# Patient Record
Sex: Female | Born: 1950 | Race: White | Hispanic: No | Smoking: Never smoker
Health system: Southern US, Community
[De-identification: ages and names within clinical notes are randomized; demographics above are authoritative.]

---

## 1998-05-04 ENCOUNTER — Ambulatory Visit (HOSPITAL_COMMUNITY): Admission: RE | Admit: 1998-05-04 | Discharge: 1998-05-04 | Payer: Self-pay | Admitting: *Deleted

## 1998-10-20 ENCOUNTER — Ambulatory Visit (HOSPITAL_COMMUNITY): Admission: RE | Admit: 1998-10-20 | Discharge: 1998-10-20 | Payer: Self-pay | Admitting: *Deleted

## 1998-11-17 ENCOUNTER — Other Ambulatory Visit: Admission: RE | Admit: 1998-11-17 | Discharge: 1998-11-17 | Payer: Self-pay | Admitting: *Deleted

## 1999-10-31 ENCOUNTER — Ambulatory Visit (HOSPITAL_COMMUNITY): Admission: RE | Admit: 1999-10-31 | Discharge: 1999-10-31 | Payer: Self-pay | Admitting: *Deleted

## 2000-11-04 ENCOUNTER — Ambulatory Visit (HOSPITAL_COMMUNITY): Admission: RE | Admit: 2000-11-04 | Discharge: 2000-11-04 | Payer: Self-pay | Admitting: *Deleted

## 2000-12-26 ENCOUNTER — Other Ambulatory Visit: Admission: RE | Admit: 2000-12-26 | Discharge: 2000-12-26 | Payer: Self-pay | Admitting: *Deleted

## 2001-04-27 ENCOUNTER — Encounter: Admission: RE | Admit: 2001-04-27 | Discharge: 2001-05-26 | Payer: Self-pay | Admitting: Family Medicine

## 2001-12-18 ENCOUNTER — Ambulatory Visit (HOSPITAL_COMMUNITY): Admission: RE | Admit: 2001-12-18 | Discharge: 2001-12-18 | Payer: Self-pay | Admitting: *Deleted

## 2002-01-28 ENCOUNTER — Encounter: Admission: RE | Admit: 2002-01-28 | Discharge: 2002-01-28 | Payer: Self-pay | Admitting: Family Medicine

## 2002-01-28 ENCOUNTER — Encounter: Payer: Self-pay | Admitting: Family Medicine

## 2002-02-09 ENCOUNTER — Encounter (INDEPENDENT_AMBULATORY_CARE_PROVIDER_SITE_OTHER): Payer: Self-pay | Admitting: *Deleted

## 2002-02-09 ENCOUNTER — Ambulatory Visit (HOSPITAL_COMMUNITY): Admission: RE | Admit: 2002-02-09 | Discharge: 2002-02-09 | Payer: Self-pay | Admitting: Family Medicine

## 2002-02-09 ENCOUNTER — Encounter: Payer: Self-pay | Admitting: Family Medicine

## 2003-04-29 ENCOUNTER — Ambulatory Visit (HOSPITAL_COMMUNITY): Admission: RE | Admit: 2003-04-29 | Discharge: 2003-04-29 | Payer: Self-pay | Admitting: *Deleted

## 2004-06-05 ENCOUNTER — Ambulatory Visit (HOSPITAL_COMMUNITY): Admission: RE | Admit: 2004-06-05 | Discharge: 2004-06-05 | Payer: Self-pay | Admitting: *Deleted

## 2004-12-19 ENCOUNTER — Ambulatory Visit: Payer: Self-pay | Admitting: Family Medicine

## 2005-01-16 ENCOUNTER — Ambulatory Visit: Payer: Self-pay | Admitting: Internal Medicine

## 2005-01-22 ENCOUNTER — Ambulatory Visit: Payer: Self-pay | Admitting: Internal Medicine

## 2005-06-25 ENCOUNTER — Ambulatory Visit: Payer: Self-pay | Admitting: Family Medicine

## 2005-07-03 ENCOUNTER — Ambulatory Visit (HOSPITAL_COMMUNITY): Admission: RE | Admit: 2005-07-03 | Discharge: 2005-07-03 | Payer: Self-pay | Admitting: *Deleted

## 2005-12-18 ENCOUNTER — Ambulatory Visit: Payer: Self-pay | Admitting: Family Medicine

## 2006-03-31 ENCOUNTER — Ambulatory Visit: Payer: Self-pay | Admitting: Family Medicine

## 2006-04-07 ENCOUNTER — Ambulatory Visit: Payer: Self-pay | Admitting: Family Medicine

## 2006-04-18 ENCOUNTER — Ambulatory Visit: Payer: Self-pay | Admitting: Internal Medicine

## 2006-05-28 ENCOUNTER — Encounter: Admission: RE | Admit: 2006-05-28 | Discharge: 2006-05-28 | Payer: Self-pay | Admitting: Surgery

## 2006-06-05 ENCOUNTER — Encounter: Admission: RE | Admit: 2006-06-05 | Discharge: 2006-06-05 | Payer: Self-pay | Admitting: Surgery

## 2006-06-27 ENCOUNTER — Ambulatory Visit (HOSPITAL_COMMUNITY): Admission: RE | Admit: 2006-06-27 | Discharge: 2006-06-27 | Payer: Self-pay | Admitting: Surgery

## 2006-06-27 ENCOUNTER — Encounter (INDEPENDENT_AMBULATORY_CARE_PROVIDER_SITE_OTHER): Payer: Self-pay | Admitting: Specialist

## 2006-08-26 ENCOUNTER — Ambulatory Visit (HOSPITAL_COMMUNITY): Admission: RE | Admit: 2006-08-26 | Discharge: 2006-08-26 | Payer: Self-pay | Admitting: *Deleted

## 2007-05-08 IMAGING — US US ABDOMEN COMPLETE
1 series · 13 of 25 positions shown · non-contrast
Comparison: No prior ultrasound.  I do have the CT from [REDACTED] with the report.

CLINICAL DATA: Right upper quadrant pain.  The patient is known to have gallstones from a CT scan done at [REDACTED] in January 2004.  
 ABDOMEN ULTRASOUND:
TECHNIQUE: Complete abdominal ultrasound examination was performed including evaluation of the liver, gallbladder, bile ducts, pancreas, kidneys, spleen, IVC, and abdominal aorta.

[Series 1: us abdomen complete · 0.26mm/px · 13 of 95 slices shown]
[im 1/95]
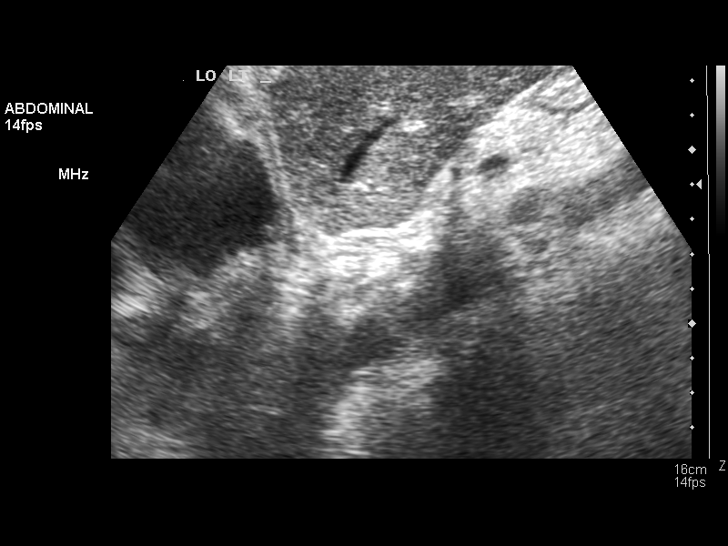
[im 8/95]
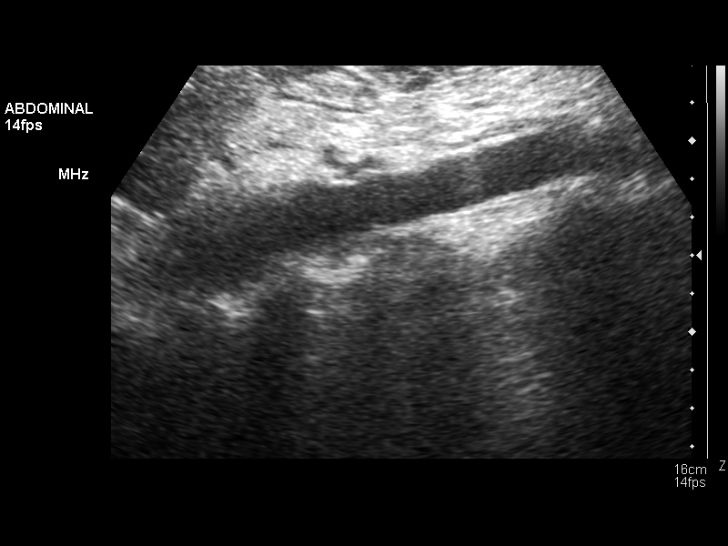
[im 16/95]
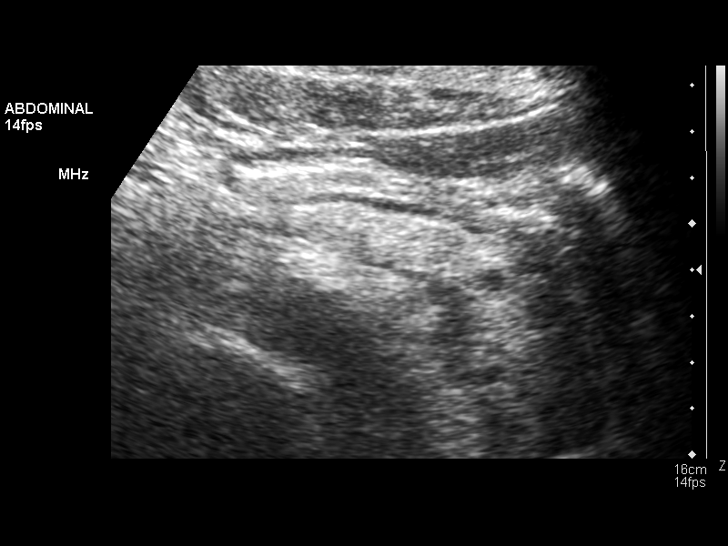
[im 24/95]
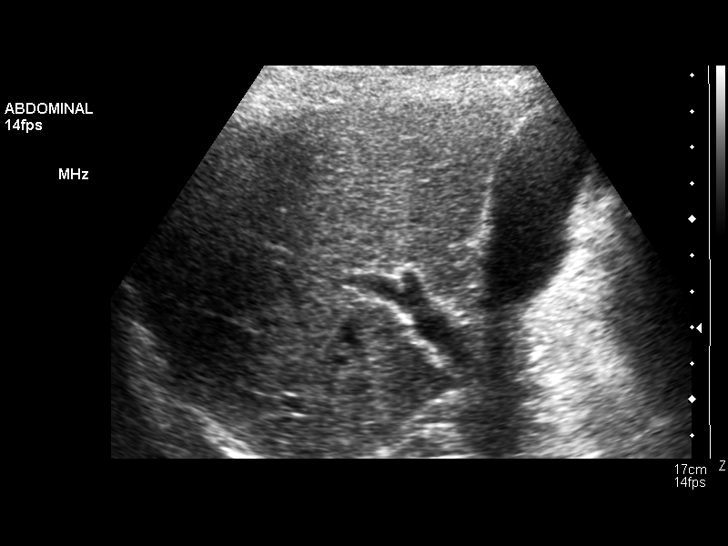
[im 32/95]
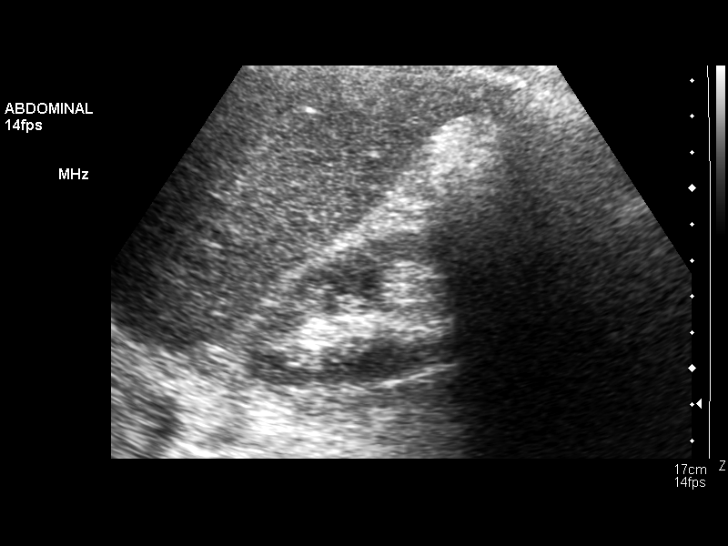
[im 40/95]
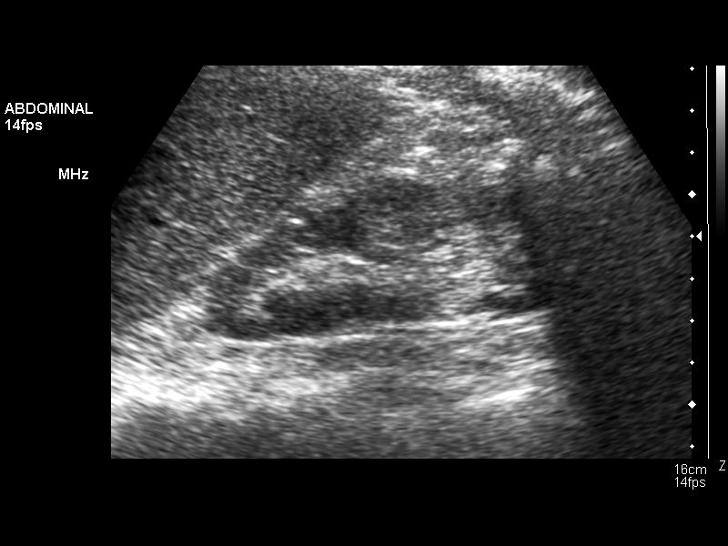
[im 48/95]
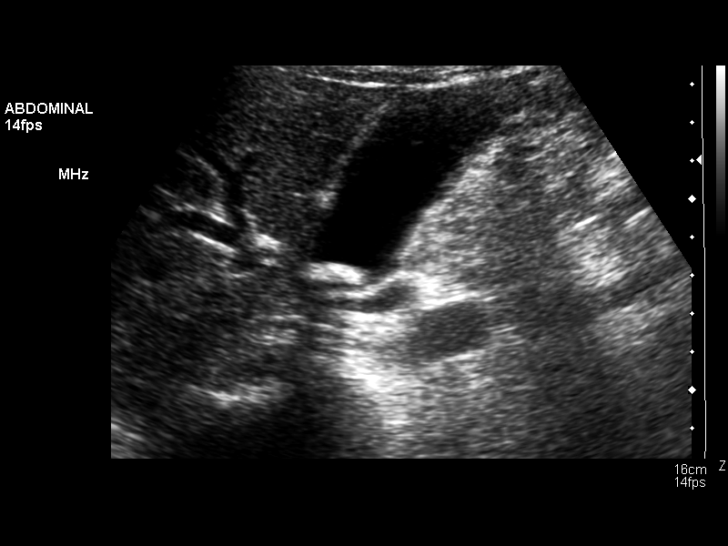
[im 55/95]
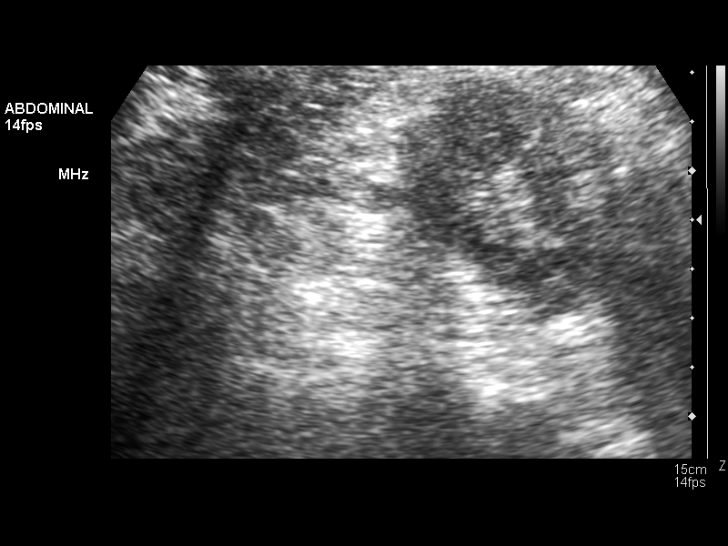
[im 63/95]
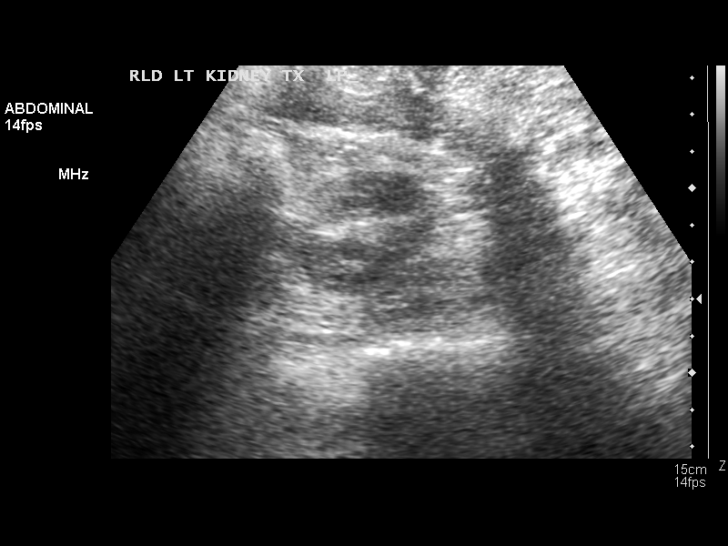
[im 71/95]
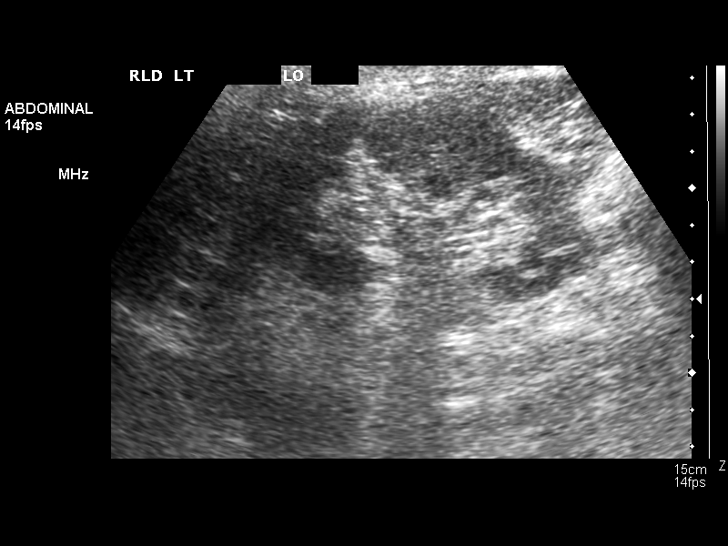
[im 79/95]
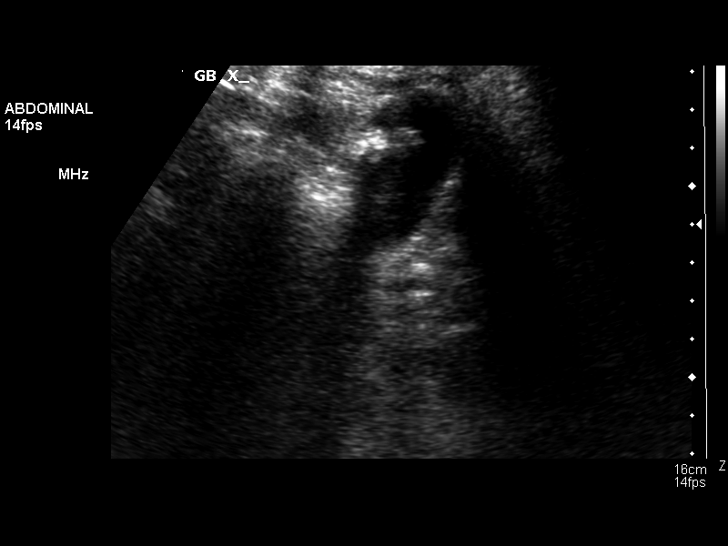
[im 87/95]
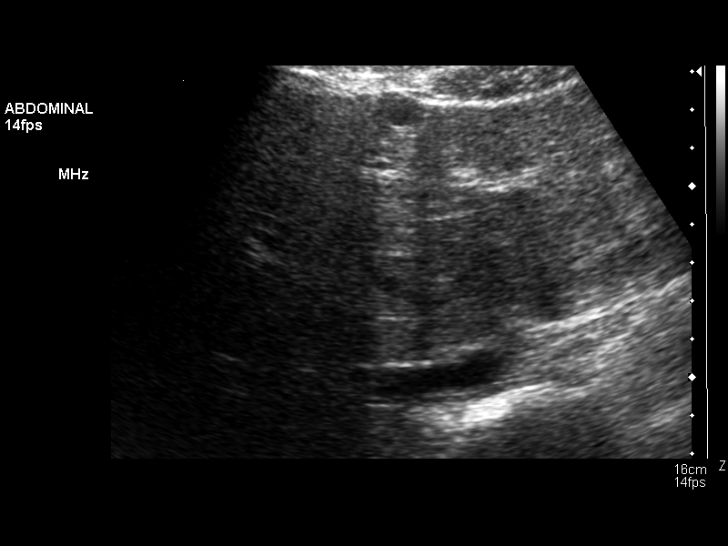
[im 95/95]
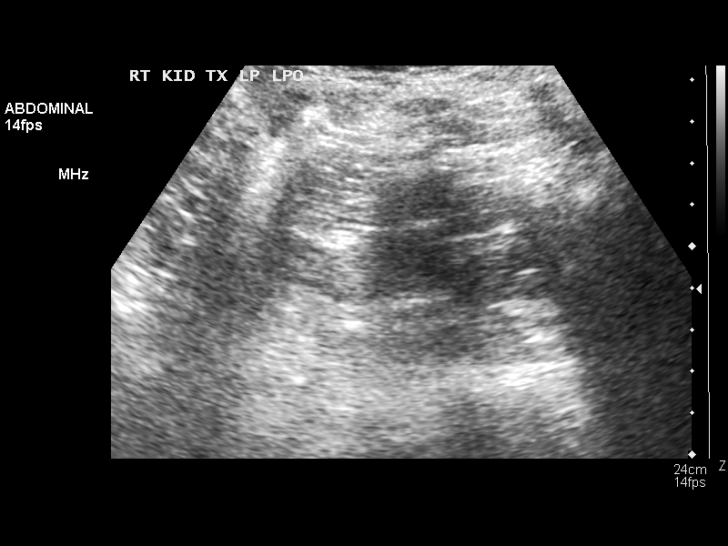

[13 of 25 positions shown; findings below may reference images not displayed]

FINDINGS: This exam confirms a 1.2 cm calcified gallstone in the neck of the gallbladder.  There is no distention of the gallbladder.  No wall thickening or edema.  No intra or extrahepatic biliary dilatation.  Common duct 3.6 mm.  Liver normal except for a 1.4 cm hypoechoic lesion in the anterior left lobe.  A 7.5 mm lesion was described on the prior CT in this same area.  Pancreas and spleen normal.  Kidney size normal without stones or obstruction.  The CT describes a subcentimeter low density lesion in the superior pole of the right kidney.  This is not seen sonographically.
 A followup CT was recommended at the time of [REDACTED] study, to further assess the lesion in the left lobe of the liver and the right kidney.  I would recommend that we repeat the CT, if it has not been done. 
 Aorta and IVC normal.  No ascites.
IMPRESSION: 1.  Cholelithiasis without evidence for cholecystitis. 
 2.  Normal biliary tree.
 3.  There is a 1.4 cm hypoechoic lesion in the left lobe of the liver.  This lesion measured 7.5 mm on a prior CT in January 2004, done at [REDACTED].
 4.  A subcentimeter lesion in the right kidney, noted on the prior CT, is not seen on today?s exam. 
 5.  Consider followup CT to further assess the liver and right kidney, as was recommended in January 2004.

## 2007-08-24 ENCOUNTER — Ambulatory Visit: Payer: Self-pay | Admitting: Family Medicine

## 2007-08-24 LAB — CONVERTED CEMR LAB
ALT: 17 units/L (ref 0–35)
AST: 17 units/L (ref 0–37)
Basophils Absolute: 0 10*3/uL (ref 0.0–0.1)
Basophils Relative: 0 % (ref 0.0–1.0)
Bilirubin Urine: NEGATIVE
Bilirubin, Direct: 0.1 mg/dL (ref 0.0–0.3)
CO2: 31 meq/L (ref 19–32)
Calcium: 9.3 mg/dL (ref 8.4–10.5)
Eosinophils Absolute: 0.1 10*3/uL (ref 0.0–0.6)
GFR calc Af Amer: 95 mL/min
GFR calc non Af Amer: 78 mL/min
Glucose, Bld: 93 mg/dL (ref 70–99)
Glucose, Urine, Semiquant: NEGATIVE
HDL: 66.5 mg/dL (ref >39.0)
MCHC: 33.4 g/dL (ref 30.0–36.0)
Monocytes Relative: 9.2 % (ref 3.0–11.0)
Neutro Abs: 1.8 10*3/uL (ref 1.4–7.7)
Platelets: 310 10*3/uL (ref 150–400)
Protein, U semiquant: NEGATIVE
RDW: 11.6 % (ref 11.5–14.6)
Specific Gravity, Urine: 1.025
TSH: 0.49 microintl units/mL (ref 0.35–5.50)
Total CHOL/HDL Ratio: 3.7
Total Protein: 6.8 g/dL (ref 6.0–8.3)
Triglycerides: 80 mg/dL (ref 0–149)
VLDL: 16 mg/dL (ref 0–40)
WBC Urine, dipstick: NEGATIVE
WBC: 3.9 10*3/uL — ABNORMAL LOW (ref 4.5–10.5)

## 2007-08-31 ENCOUNTER — Ambulatory Visit: Payer: Self-pay | Admitting: Family Medicine

## 2007-08-31 DIAGNOSIS — N959 Unspecified menopausal and perimenopausal disorder: Secondary | ICD-10-CM | POA: Insufficient documentation

## 2007-08-31 DIAGNOSIS — E039 Hypothyroidism, unspecified: Secondary | ICD-10-CM | POA: Insufficient documentation

## 2007-11-16 ENCOUNTER — Ambulatory Visit (HOSPITAL_COMMUNITY): Admission: RE | Admit: 2007-11-16 | Discharge: 2007-11-16 | Payer: Self-pay | Admitting: Obstetrics & Gynecology

## 2008-01-24 ENCOUNTER — Emergency Department (HOSPITAL_COMMUNITY): Admission: EM | Admit: 2008-01-24 | Discharge: 2008-01-24 | Payer: Self-pay | Admitting: Emergency Medicine

## 2008-01-25 ENCOUNTER — Ambulatory Visit: Payer: Self-pay | Admitting: Family Medicine

## 2008-01-25 DIAGNOSIS — I4949 Other premature depolarization: Secondary | ICD-10-CM

## 2009-01-24 ENCOUNTER — Ambulatory Visit (HOSPITAL_COMMUNITY): Admission: RE | Admit: 2009-01-24 | Discharge: 2009-01-24 | Payer: Self-pay | Admitting: Obstetrics & Gynecology

## 2009-11-16 ENCOUNTER — Ambulatory Visit: Payer: Self-pay | Admitting: Family Medicine

## 2009-11-16 DIAGNOSIS — H9209 Otalgia, unspecified ear: Secondary | ICD-10-CM | POA: Insufficient documentation

## 2009-11-16 LAB — CONVERTED CEMR LAB
ALT: 16 units/L (ref 0–35)
AST: 21 units/L (ref 0–37)
Alkaline Phosphatase: 68 units/L (ref 39–117)
Basophils Absolute: 0 10*3/uL (ref 0.0–0.1)
Bilirubin Urine: NEGATIVE
CO2: 30 meq/L (ref 19–32)
Chloride: 102 meq/L (ref 96–112)
Cholesterol: 248 mg/dL — ABNORMAL HIGH (ref 0–200)
Direct LDL: 169.4 mg/dL
Eosinophils Absolute: 0.1 10*3/uL (ref 0.0–0.7)
Eosinophils Relative: 2.6 % (ref 0.0–5.0)
Glucose, Bld: 87 mg/dL (ref 70–99)
Glucose, Urine, Semiquant: NEGATIVE
Hemoglobin: 13.2 g/dL (ref 12.0–15.0)
MCHC: 34.1 g/dL (ref 30.0–36.0)
Monocytes Absolute: 0.4 10*3/uL (ref 0.1–1.0)
Neutrophils Relative %: 55.6 % (ref 43.0–77.0)
Platelets: 318 10*3/uL (ref 150.0–400.0)
Protein, U semiquant: NEGATIVE
RBC: 4.03 M/uL (ref 3.87–5.11)
Sodium: 141 meq/L (ref 135–145)
Total Bilirubin: 0.7 mg/dL (ref 0.3–1.2)
Triglycerides: 156 mg/dL — ABNORMAL HIGH (ref 0.0–149.0)

## 2009-11-23 ENCOUNTER — Ambulatory Visit: Payer: Self-pay | Admitting: Family Medicine

## 2009-11-23 DIAGNOSIS — Z87448 Personal history of other diseases of urinary system: Secondary | ICD-10-CM

## 2009-12-22 ENCOUNTER — Encounter (INDEPENDENT_AMBULATORY_CARE_PROVIDER_SITE_OTHER): Payer: Self-pay | Admitting: *Deleted

## 2010-03-02 ENCOUNTER — Ambulatory Visit (HOSPITAL_COMMUNITY): Admission: RE | Admit: 2010-03-02 | Discharge: 2010-03-02 | Payer: Self-pay | Admitting: Obstetrics & Gynecology

## 2011-01-06 ENCOUNTER — Encounter: Payer: Self-pay | Admitting: Obstetrics & Gynecology

## 2011-01-17 NOTE — Letter (Signed)
Summary: Colonoscopy Date Change Letter  [facility]  [address] [place], [place] [address]   Phone: [phone]  Fax: [phone]      December 22, 2009 MRN: [id number]   Holly Sims [address] [place], [place]  [address]   Dear [name],   Previously you were recommended to have a repeat colonoscopy around this time. Your chart was recently reviewed by Dr. Hedwig Morton. Juanda Chance of [facility]. Follow up colonoscopy is now recommended in February 2013. This revised recommendation is based on current, nationally recognized guidelines for colorectal cancer screening and polyp surveillance. These guidelines are endorsed by the American Cancer Society, [employer] on Colorectal Cancer as well as numerous other major medical organizations.  Please understand that our recommendation assumes that you do not have any new symptoms such as bleeding, a change in bowel habits, anemia, or significant abdominal discomfort. If you do have any concerning GI symptoms or want to discuss the guideline recommendations, please call to arrange an office visit at your earliest convenience. Otherwise we will keep you in our reminder system and contact you 1-2 months prior to the date listed above to schedule your next colonoscopy.  Thank you,  Hedwig Morton. Juanda Chance, M.D.  [facility] [phone]

## 2011-04-12 ENCOUNTER — Other Ambulatory Visit (HOSPITAL_COMMUNITY): Payer: Self-pay | Admitting: Obstetrics & Gynecology

## 2011-04-12 DIAGNOSIS — Z1231 Encounter for screening mammogram for malignant neoplasm of breast: Secondary | ICD-10-CM

## 2011-04-19 ENCOUNTER — Ambulatory Visit (HOSPITAL_COMMUNITY)
Admission: RE | Admit: 2011-04-19 | Discharge: 2011-04-19 | Disposition: A | Discharge status: Home / Self Care | Payer: 59 | Source: Ambulatory Visit | Attending: Obstetrics & Gynecology | Admitting: Obstetrics & Gynecology

## 2011-04-19 DIAGNOSIS — Z1231 Encounter for screening mammogram for malignant neoplasm of breast: Secondary | ICD-10-CM | POA: Insufficient documentation

## 2011-05-03 NOTE — Op Note (Signed)
NAME:  Holly Sims, Holly Sims NO.:  [id number]   MEDICAL RECORD NO.:  [id number]          PATIENT TYPE:  AMB   LOCATION:  DAY                          FACILITY:  [facility]   PHYSICIAN:  Currie Paris, M.D.DATE OF BIRTH:  12/14/1951   DATE OF PROCEDURE:  06/27/2006  DATE OF DISCHARGE:                                 OPERATIVE REPORT   OFFICE MEDICAL RECORD NUMBER [id number] [phone].   PREOPERATIVE DIAGNOSIS:  Gallstones.   POSTOPERATIVE DIAGNOSIS:  Gallstones.   OPERATION:  Laparoscopic cholecystectomy with operative cholangiogram.   SURGEON:  Currie Paris, M.D.   ASSISTANT:  Anselm Pancoast. Zachery Dakins, M.D.   ANESTHESIA:  General endotracheal.   CLINICAL HISTORY:  This is a 60 year old lady who is known to have a single  large stone in her gallbladder but had presented now with some increasing of  biliary-type symptoms and elected to proceed to cholecystectomy.   DESCRIPTION OF PROCEDURE:  The patient was seen in the holding area and  confirmed that cholecystectomy was the planned procedure.  She had no  further questions.   She was taken to the operating room and after satisfactory general  endotracheal anesthesia had been obtained, the abdomen was prepped and  draped as a sterile field.  The time-out occurred.   Plain 0.25% Marcaine infiltrated in the umbilical area.  The incision was  made, the fascia opened, and the perineal cavity entered under direct  vision.  A pursestring was placed and the Hasson introduced and the abdomen  insufflated to 15.   The patient was placed in reverse Trendelenburg and tilted to the left.  We  saw no gross abnormalities in examining the abdomen.  The gallbladder did  have multiple omental adhesions.  The gallbladder was retracted over the  liver and the omentum dissected down until we could identify the triangle of  Calot.  That was dissected out nicely and I had a long section of cystic  duct and also saw the cystic artery, had  that dissected out so we had a nice  window.   I put a single clip on the cystic artery and a single clip on the duct at  the junction with the gallbladder.  The cystic duct was opened and a Cook  catheter introduced percutaneously for cholangiography.   The operative cholangiogram showed a very long cystic duct with a low entry  point.  The common hepatic duct and radicles filled nicely and there was  good flow into the duodenum.  There was a small question of filling defect  distally and review with the radiologist thought this was something  extrinsic but not clinically significant.   The cystic duct catheter was removed and 3 clips placed on the stay side of  the cystic duct and it was divided.  Additional clips were placed on the  cystic artery so we had three clips on the stay side and it was divided.  The gallbladder was removed from below to above with coagulation current of  the cautery.  A small posterior vessel was also clipped.   The gallbladder was placed  in a bag.  I irrigated and made sure everything  was dry.   The gallbladder was then brought out the umbilical port.  We did a final  check for hemostasis and again everything appeared to be dry.  The lateral  ports were removed under direct vision and there was no bleeding.  The  umbilical port was closed with the Vicryl pursestring suture.  The abdomen  was deflated through the epigastric port.  The skin was closed with 4-0  Monocryl subcuticular and Dermabond.   The patient tolerated the procedure well.  There were no operative  complications.  All counts were correct.      Currie Paris, M.D.  Electronically Signed     CJS/MEDQ  D:  06/27/2006  T:  06/27/2006  Job:  [id number]

## 2011-09-06 LAB — CBC
HCT: 37.3
Hemoglobin: 12.9
MCHC: 34.5
MCV: 91.3
Platelets: 269
RDW: 12.4

## 2011-09-06 LAB — POCT CARDIAC MARKERS
CKMB, poc: <1 — ABNORMAL LOW
Myoglobin, poc: 89.9
Operator id: 284251

## 2011-09-06 LAB — POCT I-STAT CREATININE: Creatinine, Ser: 1.1

## 2011-09-06 LAB — I-STAT 8, (EC8 V) (CONVERTED LAB)
Acid-Base Excess: 1
Chloride: 107
HCT: 40
Sodium: 138
pCO2, Ven: 34.1 — ABNORMAL LOW

## 2011-09-06 LAB — DIFFERENTIAL: Basophils Absolute: 0

## 2011-10-14 ENCOUNTER — Ambulatory Visit (INDEPENDENT_AMBULATORY_CARE_PROVIDER_SITE_OTHER): Payer: 59 | Admitting: Family Medicine

## 2011-10-14 ENCOUNTER — Encounter: Payer: Self-pay | Admitting: Family Medicine

## 2011-10-14 DIAGNOSIS — F4321 Adjustment disorder with depressed mood: Secondary | ICD-10-CM

## 2011-10-14 DIAGNOSIS — J301 Allergic rhinitis due to pollen: Secondary | ICD-10-CM | POA: Insufficient documentation

## 2011-10-14 MED ORDER — PREDNISONE 20 MG PO TABS: ORAL_TABLET | ORAL | Status: DC

## 2011-10-14 MED ORDER — LORAZEPAM 1 MG PO TABS: ORAL_TABLET | ORAL | Status: DC

## 2011-10-14 NOTE — Progress Notes (Signed)
  Subjective:    Patient ID: Holly Sims, female    DOB: July 14, 1951, 60 y.o.   MRN: [id number]  HPI Alexsia is a 60 year old recently widowed female......... Her husband died 6 months ago..... Pancreatic cancer......... Who comes in with a two week history of head congestion, postnasal drip, and cough.  Alona Bene has trouble in the fall with her, allergies she's tried over-the-counter medications.  Nothing seems to help.  She is coughing, but not wheezing, and no fever.   Review of Systems    General and ENT and pulmonary review of systems otherwise negative Objective:   Physical Exam  Well-developed well-nourished, female, no acute distress.  Examination of the HEENT were negative, except for 4+ nasal edema.  Neck is supple.  Lungs are clear      Assessment & Plan:  Allergic rhinitis........... Prednisone burst and taper

## 2011-10-14 NOTE — Patient Instructions (Signed)
Begin the prednisone as directed.  Ativan one half or one tablet at bedtime p.r.n.  Return next Thursday at 4 p.m. For your physical exam we member regular breakfast light lunch so we can do your lab work.  The same day

## 2011-10-24 ENCOUNTER — Ambulatory Visit (INDEPENDENT_AMBULATORY_CARE_PROVIDER_SITE_OTHER): Payer: 59 | Admitting: Family Medicine

## 2011-10-24 ENCOUNTER — Encounter: Payer: Self-pay | Admitting: Family Medicine

## 2011-10-24 DIAGNOSIS — Z87448 Personal history of other diseases of urinary system: Secondary | ICD-10-CM

## 2011-10-24 DIAGNOSIS — E039 Hypothyroidism, unspecified: Secondary | ICD-10-CM

## 2011-10-24 DIAGNOSIS — Z0000 Encounter for general adult medical examination without abnormal findings: Secondary | ICD-10-CM

## 2011-10-24 LAB — POCT URINALYSIS DIPSTICK
Ketones, UA: NEGATIVE
Nitrite, UA: NEGATIVE
Protein, UA: NEGATIVE
Urobilinogen, UA: 0.2

## 2011-10-24 MED ORDER — LEVOTHYROXINE SODIUM 50 MCG PO TABS: 50.0000 ug | ORAL_TABLET | Freq: Every day | ORAL | Status: DC

## 2011-10-24 NOTE — Progress Notes (Signed)
  Subjective:    Patient ID: Holly Sims, female    DOB: [birth year], 60 y.o.   MRN: [id number]  HPI  Holly Sims is a 60 year old recently widowed female.  Her husband Louanne Skye and died last summer from pancreatic cancer, who comes in today for general physical examination because of a history of hypothyroidism,  She's always been in excellent health except for the thyroid.  She has no chronic health problems.  She gets routine eye care, dental care, BSE monthly, and he mammography, colonoscopy 4 years ago, showed one small polyp.  Tetanus 2005, mammogram, August 2012, packed August 2012, declines a flu shot, but once the shingles, vaccine  Review of Systems  Constitutional: Negative.   HENT: Negative.   Eyes: Negative.   Respiratory: Negative.   Cardiovascular: Negative.   Gastrointestinal: Negative.   Genitourinary: Negative.   Musculoskeletal: Negative.   Neurological: Negative.   Hematological: Negative.   Psychiatric/Behavioral: Negative.        Objective:   Physical Exam  Constitutional: She appears well-developed and well-nourished.  HENT:  Head: Normocephalic and atraumatic.  Right Ear: External ear normal.  Left Ear: External ear normal.  Nose: Nose normal.  Mouth/Throat: Oropharynx is clear and moist.  Eyes: EOM are normal. Pupils are equal, round, and reactive to light.  Neck: Normal range of motion. Neck supple. No thyromegaly present.  Cardiovascular: Normal rate, regular rhythm, normal heart sounds and intact distal pulses.  Exam reveals no gallop and no friction rub.   No murmur heard. Pulmonary/Chest: Effort normal and breath sounds normal.  Abdominal: Soft. Bowel sounds are normal. She exhibits no distension and no mass. There is no tenderness. There is no rebound.  Genitourinary: Guaiac negative stool.       Bilateral breast exam normal  Musculoskeletal: Normal range of motion.  Lymphadenopathy:    She has no cervical adenopathy.  Neurological: She is alert. She  has normal reflexes. No cranial nerve deficit. She exhibits normal muscle tone. Coordination normal.  Skin: Skin is warm and dry.  Psychiatric: She has a normal mood and affect. Her behavior is normal. Judgment and thought content normal.          Assessment & Plan:  Healthy female.  History of hypothyroidism.  Continue Synthroid.  Check labs

## 2011-10-24 NOTE — Patient Instructions (Signed)
Continue your current medications.  Follow-up in one year or sooner if any problem.  Call  when you find out if you can get the shingles.  Vaccine here

## 2011-10-25 LAB — CBC WITH DIFFERENTIAL/PLATELET
Basophils Absolute: 0 10*3/uL (ref 0.0–0.1)
Eosinophils Relative: 2.6 % (ref 0.0–5.0)
MCV: 93.8 fl (ref 78.0–100.0)
Monocytes Absolute: 0.4 10*3/uL (ref 0.1–1.0)
Neutrophils Relative %: 54.5 % (ref 43.0–77.0)
Platelets: 307 10*3/uL (ref 150.0–400.0)
RDW: 12.8 % (ref 11.5–14.6)
WBC: 4.4 10*3/uL — ABNORMAL LOW (ref 4.5–10.5)

## 2011-10-25 LAB — BASIC METABOLIC PANEL
Calcium: 9.2 mg/dL (ref 8.4–10.5)
Chloride: 104 mEq/L (ref 96–112)
Creatinine, Ser: 1 mg/dL (ref 0.4–1.2)
GFR: 62.42 mL/min (ref >60.00)

## 2011-10-25 LAB — TSH: TSH: 0.22 u[IU]/mL — ABNORMAL LOW (ref 0.35–5.50)

## 2011-10-25 LAB — LIPID PANEL
Cholesterol: 214 mg/dL — ABNORMAL HIGH (ref 0–200)
Triglycerides: 112 mg/dL (ref 0.0–149.0)

## 2011-10-25 LAB — HEPATIC FUNCTION PANEL
ALT: 16 U/L (ref 0–35)
AST: 16 U/L (ref 0–37)
Albumin: 4.1 g/dL (ref 3.5–5.2)

## 2011-10-25 LAB — LDL CHOLESTEROL, DIRECT: Direct LDL: 143.4 mg/dL

## 2011-10-29 NOTE — Progress Notes (Signed)
Quick Note:  Pt informed on VM to call back to document the chart if blood in urine is a chronic thing. ______

## 2011-11-04 ENCOUNTER — Telehealth: Payer: Self-pay | Admitting: *Deleted

## 2011-11-04 NOTE — Telephone Encounter (Signed)
Patient is calling because she received a copy of her lab results.  She noticed that her TSH is low and would like to know if she should change her thyroid medication dosage?

## 2011-11-04 NOTE — Telephone Encounter (Signed)
The same dose, but only take it Monday through Friday

## 2011-11-05 NOTE — Telephone Encounter (Signed)
Left message on machine for patient  To return our call 

## 2011-12-11 ENCOUNTER — Ambulatory Visit (INDEPENDENT_AMBULATORY_CARE_PROVIDER_SITE_OTHER): Payer: 59 | Admitting: Family Medicine

## 2011-12-11 ENCOUNTER — Encounter: Payer: Self-pay | Admitting: Family Medicine

## 2011-12-11 VITALS — BP 120/80 | Temp 98.8°F | Wt 138.0 lb

## 2011-12-11 DIAGNOSIS — J0100 Acute maxillary sinusitis, unspecified: Secondary | ICD-10-CM

## 2011-12-11 MED ORDER — AZITHROMYCIN 250 MG PO TABS: ORAL_TABLET | ORAL | Status: DC

## 2011-12-11 NOTE — Progress Notes (Signed)
  Subjective:    Patient ID: Holly Sims, female    DOB: 04/07/1951, 60 y.o.   MRN: [id number]  HPI 60 year old white female, nonsmoker, and with complaints of sinus pressure and pain decreased smell and taste cough with productive yellow sputum and one off for a week and worsening. She has not taken any medications over-the-counter. Denies any sick contacts   Review of Systems  Constitutional: Negative.   HENT: Positive for congestion, sneezing, postnasal drip and sinus pressure.   Respiratory: Positive for cough.   Cardiovascular: Negative.   Skin: Negative.   Neurological: Negative.   Hematological: Negative.    No past medical history on file.  History   Social History  . Marital Status: Widowed    Spouse Name: N/A    Number of Children: N/A  . Years of Education: N/A   Occupational History  . Not on file.   Social History Main Topics  . Smoking status: Never Smoker   . Smokeless tobacco: Never Used  . Alcohol Use: Yes     wine occ.   . Drug Use: Not on file  . Sexually Active: Not on file   Other Topics Concern  . Not on file   Social History Narrative  . No narrative on file    No past surgical history on file.  No family history on file.  Allergies  Allergen Reactions  . Penicillins     REACTION: anaphylaxsis  . Povidone-Iodine     REACTION: rash    Current Outpatient Prescriptions on File Prior to Visit  Medication Sig Dispense Refill  . levothyroxine (SYNTHROID, LEVOTHROID) 50 MCG tablet Take 1 tablet (50 mcg total) by mouth daily.  100 tablet  3  . LORazepam (ATIVAN) 1 MG tablet One tab nightly p.r.n.  30 tablet  3  . predniSONE (DELTASONE) 20 MG tablet Two tabs x 3 days, one x 3 days, a half x 3 days, then half a tab Monday, Wednesday, Friday, for a two-week taper  30 tablet  1    BP 120/80  Temp(Src) 98.8 F (37.1 C) (Oral)  Wt 138 lb (62.596 kg)chart   Objective:   Physical Exam  Constitutional: She is oriented to person, place, and  time.  Pulmonary/Chest: Effort normal and breath sounds normal.  Abdominal: Soft. Bowel sounds are normal.  Musculoskeletal: Normal range of motion.  Neurological: She is alert and oriented to person, place, and time.  Skin: Skin is warm and dry.          Assessment & Plan:  Assessment: Acute sinusitis  Plan: Over-the-counter antihistamine once daily. Z-Pak as directed. Rest. Drink plenty of fluids. Call if symptoms worsen or persist. Recheck if there's a when necessary.

## 2011-12-16 ENCOUNTER — Ambulatory Visit (INDEPENDENT_AMBULATORY_CARE_PROVIDER_SITE_OTHER): Payer: 59 | Admitting: Family Medicine

## 2011-12-16 ENCOUNTER — Encounter: Payer: Self-pay | Admitting: Family Medicine

## 2011-12-16 VITALS — BP 118/78 | Temp 98.1°F | Wt 139.0 lb

## 2011-12-16 DIAGNOSIS — J45909 Unspecified asthma, uncomplicated: Secondary | ICD-10-CM

## 2011-12-16 DIAGNOSIS — R062 Wheezing: Secondary | ICD-10-CM

## 2011-12-16 DIAGNOSIS — R05 Cough: Secondary | ICD-10-CM

## 2011-12-16 NOTE — Progress Notes (Signed)
  Subjective:    Patient ID: Holly Sims, female    DOB: [birth year], 60 y.o.   MRN: [id number]  HPI Holly Sims is a 60 year old recently widowed female........ Husband died last summer from pancreatic cancer.......... Who comes in today for evaluation of a congestion, sore throat, and cough.  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  She's had these symptoms now for two weeks.  No pertinent previous history of allergy.  Her symptoms are a congestion, postnasal drip, and cough without sputum production.   Review of Systems    General and pulmonary venous systems otherwise negative Objective:   Physical Exam  Well developed, well nourished, female no acute distress.  HEENT are negative.  Neck was supple.  No adenopathy.  Lungs are clear except for some mild late expiratory wheezing.      Assessment & Plan:  Viral syndrome or allergic reaction was mild secondary wheezing.  Plan prednisone burst and taper.  Return p.r.n.

## 2011-12-16 NOTE — Patient Instructions (Signed)
Take the prednisone as directed.  Return p.r.n. 

## 2011-12-29 ENCOUNTER — Other Ambulatory Visit: Payer: Self-pay | Admitting: Family Medicine

## 2012-02-07 ENCOUNTER — Encounter: Payer: Self-pay | Admitting: Internal Medicine

## 2012-05-26 ENCOUNTER — Other Ambulatory Visit (HOSPITAL_COMMUNITY): Payer: Self-pay | Admitting: Obstetrics & Gynecology

## 2012-05-26 DIAGNOSIS — Z1231 Encounter for screening mammogram for malignant neoplasm of breast: Secondary | ICD-10-CM

## 2012-06-19 ENCOUNTER — Ambulatory Visit (HOSPITAL_COMMUNITY): Payer: 59

## 2012-07-06 ENCOUNTER — Ambulatory Visit (HOSPITAL_COMMUNITY)
Admission: RE | Admit: 2012-07-06 | Discharge: 2012-07-06 | Disposition: A | Discharge status: Home / Self Care | Payer: 59 | Source: Ambulatory Visit | Attending: Obstetrics & Gynecology | Admitting: Obstetrics & Gynecology

## 2012-07-06 DIAGNOSIS — Z1231 Encounter for screening mammogram for malignant neoplasm of breast: Secondary | ICD-10-CM

## 2012-11-02 ENCOUNTER — Encounter: Payer: Self-pay | Admitting: Family Medicine

## 2012-11-02 ENCOUNTER — Ambulatory Visit (INDEPENDENT_AMBULATORY_CARE_PROVIDER_SITE_OTHER): Payer: 59 | Admitting: Family Medicine

## 2012-11-02 VITALS — BP 110/70 | Temp 98.3°F | Wt 143.0 lb

## 2012-11-02 DIAGNOSIS — W458XXA Other foreign body or object entering through skin, initial encounter: Secondary | ICD-10-CM

## 2012-11-02 DIAGNOSIS — T148XXA Other injury of unspecified body region, initial encounter: Secondary | ICD-10-CM

## 2012-11-02 NOTE — Progress Notes (Signed)
  Subjective:    Patient ID: Holly Sims, female    DOB: [birth year], 61 y.o.   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  Review of Systems Review of systems negative    Objective:   Physical Exam Well-developed well-nourished female no acute distress examination of finger shows a quarter-inch splinter volar surface of the index finger.  The surface was cleaned with alcohol and with a 15-gauge needle it was removed without complications       Assessment & Plan:  Splinter finger removed

## 2012-11-02 NOTE — Patient Instructions (Signed)
Return when necessary 

## 2012-12-25 ENCOUNTER — Telehealth: Payer: Self-pay | Admitting: Family Medicine

## 2012-12-25 MED ORDER — LEVOTHYROXINE SODIUM 50 MCG PO TABS: 50.0000 ug | ORAL_TABLET | Freq: Every day | ORAL | Status: DC

## 2012-12-25 NOTE — Telephone Encounter (Signed)
Spoke with patient and she should call back and schedule a physical

## 2012-12-25 NOTE — Telephone Encounter (Signed)
Patient called stating that she need to come in and have her tsh done and an appt. Please advise.

## 2012-12-25 NOTE — Telephone Encounter (Signed)
Pt has cpx appt on 02/15/13. She will run out of levothyroxine (SYNTHROID, LEVOTHROID) 50 MCG tablet in the next 10-14 days. Can you send her in refills on this to get her to the 3/3/ cpx appt? Thanks

## 2012-12-25 NOTE — Telephone Encounter (Signed)
Patient called stating that her levothyroxine should have been sent to [facility] not [facility]. Please assist.

## 2013-02-05 ENCOUNTER — Other Ambulatory Visit (INDEPENDENT_AMBULATORY_CARE_PROVIDER_SITE_OTHER): Payer: 59

## 2013-02-05 LAB — CBC WITH DIFFERENTIAL/PLATELET
Basophils Relative: 0.7 % (ref 0.0–3.0)
Eosinophils Relative: 2.5 % (ref 0.0–5.0)
HCT: 38.1 % (ref 36.0–46.0)
Lymphs Abs: 1.1 10*3/uL (ref 0.7–4.0)
MCHC: 33.8 g/dL (ref 30.0–36.0)
MCV: 91.6 fl (ref 78.0–100.0)
Monocytes Absolute: 0.3 10*3/uL (ref 0.1–1.0)
RBC: 4.16 Mil/uL (ref 3.87–5.11)
WBC: 3.2 10*3/uL — ABNORMAL LOW (ref 4.5–10.5)

## 2013-02-05 LAB — BASIC METABOLIC PANEL
BUN: 18 mg/dL (ref 6–23)
Calcium: 9.3 mg/dL (ref 8.4–10.5)
Creatinine, Ser: 0.9 mg/dL (ref 0.4–1.2)
GFR: 67.84 mL/min (ref >60.00)
Glucose, Bld: 92 mg/dL (ref 70–99)

## 2013-02-05 LAB — HEPATIC FUNCTION PANEL
ALT: 20 U/L (ref 0–35)
AST: 16 U/L (ref 0–37)
Alkaline Phosphatase: 66 U/L (ref 39–117)
Bilirubin, Direct: 0.1 mg/dL (ref 0.0–0.3)
Total Bilirubin: 0.8 mg/dL (ref 0.3–1.2)
Total Protein: 6.9 g/dL (ref 6.0–8.3)

## 2013-02-05 LAB — POCT URINALYSIS DIPSTICK
Bilirubin, UA: NEGATIVE
Glucose, UA: NEGATIVE
Ketones, UA: NEGATIVE
Spec Grav, UA: 1.025
Urobilinogen, UA: 0.2

## 2013-02-05 LAB — LIPID PANEL: VLDL: 16.6 mg/dL (ref 0.0–40.0)

## 2013-02-09 ENCOUNTER — Encounter: Payer: 59 | Admitting: Family Medicine

## 2013-02-15 ENCOUNTER — Encounter: Payer: 59 | Admitting: Family Medicine

## 2013-04-06 ENCOUNTER — Encounter: Payer: Self-pay | Admitting: Family Medicine

## 2013-04-06 ENCOUNTER — Ambulatory Visit (INDEPENDENT_AMBULATORY_CARE_PROVIDER_SITE_OTHER): Payer: 59 | Admitting: Family Medicine

## 2013-04-06 VITALS — BP 122/76 | HR 72 | Temp 98.3°F | Wt 143.0 lb

## 2013-04-06 DIAGNOSIS — Z0000 Encounter for general adult medical examination without abnormal findings: Secondary | ICD-10-CM

## 2013-04-06 DIAGNOSIS — Z23 Encounter for immunization: Secondary | ICD-10-CM

## 2013-04-06 DIAGNOSIS — E039 Hypothyroidism, unspecified: Secondary | ICD-10-CM

## 2013-04-06 DIAGNOSIS — J301 Allergic rhinitis due to pollen: Secondary | ICD-10-CM

## 2013-04-06 DIAGNOSIS — Z87448 Personal history of other diseases of urinary system: Secondary | ICD-10-CM

## 2013-04-06 MED ORDER — LEVOTHYROXINE SODIUM 50 MCG PO TABS: 50.0000 ug | ORAL_TABLET | Freq: Every day | ORAL | Status: DC

## 2013-04-06 NOTE — Progress Notes (Signed)
  Subjective:    Patient ID: Holly Sims, female    DOB: November 18, 1951, 62 y.o.   MRN: [id number]  HPI Holly Sims is a 62 year old widowed female,,,,,,,,,, her husband Louanne Skye died 2 years ago,,,,,,, who comes in today for general physical examination because of a history of hypothyroidism  She takes Synthroid 50 mcg daily for hypothyroidism TSH level is 0.7 within normal limits  She gets routine eye care, dental care, BSE monthly, and you mammography, colonoscopy recently normal, tetanus 2005, seasonal flu shot 2013  Pneumovax today she'll check on the shingles vaccine  She continues to work at the [employer]. She either walks or swims on a regular basis. She's very physically active. One child in [place] and the other in [place]   Review of Systems  Constitutional: Negative.   HENT: Negative.   Eyes: Negative.   Respiratory: Negative.   Cardiovascular: Negative.   Gastrointestinal: Negative.   Genitourinary: Negative.   Musculoskeletal: Negative.   Neurological: Negative.   Psychiatric/Behavioral: Negative.        Objective:   Physical Exam  Constitutional: She appears well-developed and well-nourished.  HENT:  Head: Normocephalic and atraumatic.  Right Ear: External ear normal.  Left Ear: External ear normal.  Nose: Nose normal.  Mouth/Throat: Oropharynx is clear and moist.  Eyes: EOM are normal. Pupils are equal, round, and reactive to light.  Neck: Normal range of motion. Neck supple. No thyromegaly present.  Cardiovascular: Normal rate, regular rhythm, normal heart sounds and intact distal pulses.  Exam reveals no gallop and no friction rub.   No murmur heard. Pulmonary/Chest: Effort normal and breath sounds normal.  Abdominal: Soft. Bowel sounds are normal. She exhibits no distension and no mass. There is no tenderness. There is no rebound.  Genitourinary:  Bilateral breast exam normal  She had her uterus removed for nonmalignant reasons  ovaries were left intact. She still goes to GYN  Musculoskeletal: Normal range of motion.  Lymphadenopathy:    She has no cervical adenopathy.  Neurological: She is alert. She has normal reflexes. No cranial nerve deficit. She exhibits normal muscle tone. Coordination normal.  Skin: Skin is warm and dry.  Psychiatric: She has a normal mood and affect. Her behavior is normal. Judgment and thought content normal.   She has 2,,,,,,,,,5 mm lesions on her right face scaly red have been present for about 6 months       Assessment & Plan:  Healthy female  Hypothyroidism continue Synthroid  Status post hysterectomy,,,,,, ovaries left intact,,,,,,,, for nonmalignant reasons  2 abnormal lesions right side of her face return for removal

## 2013-04-06 NOTE — Patient Instructions (Addendum)
Return sometime in the next couple weeks for a 30 minute appointment to remove the 2 lesions we discussed  Continue Synthroid once daily  Take an aspirin tablet daily  Continue exercise program  Return in one year for general physical examination

## 2013-04-13 ENCOUNTER — Ambulatory Visit (INDEPENDENT_AMBULATORY_CARE_PROVIDER_SITE_OTHER): Payer: 59 | Admitting: Family Medicine

## 2013-04-13 ENCOUNTER — Encounter: Payer: Self-pay | Admitting: Family Medicine

## 2013-04-13 DIAGNOSIS — D2330 Other benign neoplasm of skin of unspecified part of face: Secondary | ICD-10-CM | POA: Insufficient documentation

## 2013-04-13 NOTE — Progress Notes (Signed)
  Subjective:    Patient ID: MAEBELLE SULTON, female    DOB: Sep 04, 1951, 62 y.o.   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 The base was cauterized specimens were sent for pathologic analysis. She tolerated the procedure no complication Band-Aid was applied for hemostasis   Review of Systems    negative Objective:   Physical Exam  Procedure see above      Assessment & Plan:  Lesions x2 on the face clinically they appear to be mildly dysplastic nevi path pending

## 2013-06-29 ENCOUNTER — Other Ambulatory Visit (HOSPITAL_COMMUNITY): Payer: Self-pay | Admitting: Obstetrics & Gynecology

## 2013-06-29 DIAGNOSIS — Z1231 Encounter for screening mammogram for malignant neoplasm of breast: Secondary | ICD-10-CM

## 2013-07-16 ENCOUNTER — Ambulatory Visit (HOSPITAL_COMMUNITY)
Admission: RE | Admit: 2013-07-16 | Discharge: 2013-07-16 | Disposition: A | Discharge status: Home / Self Care | Payer: 59 | Source: Ambulatory Visit | Attending: Obstetrics & Gynecology | Admitting: Obstetrics & Gynecology

## 2013-07-16 DIAGNOSIS — Z1231 Encounter for screening mammogram for malignant neoplasm of breast: Secondary | ICD-10-CM

## 2013-09-20 ENCOUNTER — Encounter: Payer: Self-pay | Admitting: Family Medicine

## 2013-09-20 ENCOUNTER — Ambulatory Visit (INDEPENDENT_AMBULATORY_CARE_PROVIDER_SITE_OTHER): Payer: 59 | Admitting: Family Medicine

## 2013-09-20 ENCOUNTER — Telehealth: Payer: Self-pay | Admitting: Family Medicine

## 2013-09-20 VITALS — BP 120/70 | HR 82 | Temp 97.3°F | Wt 146.0 lb

## 2013-09-20 DIAGNOSIS — I499 Cardiac arrhythmia, unspecified: Secondary | ICD-10-CM

## 2013-09-20 DIAGNOSIS — R000 Tachycardia, unspecified: Secondary | ICD-10-CM

## 2013-09-20 NOTE — Progress Notes (Signed)
  Subjective:    Patient ID: Holly Sims, female    DOB: Aug 02, 1951, 62 y.o.   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 At 3 AM on this past weekend Saturday morning she woke up with an episode of rapid heart rate. Her pulse is very between 01/[birth year]/1929 she says was regular and not skipping. She timed it lasted 17 minutes and stopped spontaneously. This is the longest episode she had. She denies any chest pain although she does somewhat feel short of breath. Since that time she's had for 5 episodes a day they last about 5 minutes and resolve spontaneously. Her cardiac risk factors are low she's a nonsmoker no history of hypertension diabetes hyperlipidemia. She does take a thyroid supplement over-the-counter calcium vitamin D for bone health. In the summer she swims and in the winter she walks. She's not noticed any change in her exercise tolerance.   Review of Systems Review of systems negative    Objective:   Physical Exam  Well-developed well-nourished female no acute distress cardiopulmonary exam normal EKG normal      Assessment & Plan:  Episodes of rapid heart rate sound like intermittent PAT plan because her episodic but getting worse we'll get her set up for a cardiac evaluation and a Holter monitor to evaluate the rhythm disturbance

## 2013-09-20 NOTE — Patient Instructions (Signed)
Continue normal exercise program  Caffeine free diet  We will get you set up for a monitor and a consult with Dr. Shirlee Latch  In the meantime if you have an episode that would not resolve or he develop shortness of breath or chest pain with it, directly to the emergency room via EMS

## 2013-09-20 NOTE — Addendum Note (Signed)
Addended by: Kern Reap B on: 09/20/2013 11:03 AM   Modules accepted: Orders

## 2013-09-20 NOTE — Telephone Encounter (Signed)
Spoke with rachel ok to schedule per Dr. Tawanna Cooler

## 2013-09-20 NOTE — Telephone Encounter (Signed)
Appt made for 10am today w/ Dr. Tawanna Cooler.

## 2013-09-20 NOTE — Telephone Encounter (Signed)
Patient Information:  Caller Name: Henlee  Phone: [phone]  Patient: Holly Sims, Holly Sims  Gender: Female  DOB: [birth year]  Age: 61 Years  PCP: Kelle Darting [facility])  Office Follow Up:  Does the office need to follow up with this patient?: No  Instructions For The Office: N/A   Symptoms  Reason For Call & Symptoms: Woke up Sunday am around 0300 (10/5) with irregular heartrate and chest tightness.  Has had same "very sporatic".  Continues to have same today and asking for an appt.  She took 6 baby ASA Sunday 10/5 am.  Reviewed Health History In EMR: Yes  Reviewed Medications In EMR: Yes  Reviewed Allergies In EMR: Yes  Reviewed Surgeries / Procedures: Yes  Date of Onset of Symptoms: 09/19/2013  Treatments Tried: baby ASA x 6  Treatments Tried Worked: No  Guideline(s) Used:  Chest Pain  Heart Rate and Heartbeat Questions  Disposition Per Guideline:   See Today in Office  Reason For Disposition Reached:   Patient wants to be seen  Advice Given:  N/A  Patient Will Follow Care Advice:  YES  Dr. Tawanna Cooler talked with patient and advised her to come to the office now.

## 2013-09-22 ENCOUNTER — Other Ambulatory Visit (INDEPENDENT_AMBULATORY_CARE_PROVIDER_SITE_OTHER): Payer: 59

## 2013-09-22 ENCOUNTER — Telehealth: Payer: Self-pay | Admitting: Family Medicine

## 2013-09-22 DIAGNOSIS — E039 Hypothyroidism, unspecified: Secondary | ICD-10-CM

## 2013-09-22 NOTE — Telephone Encounter (Signed)
Pt called and wanted to know if Dr. Tawanna Cooler would put an order in for her to have a TSH. Pt requesting to be contacted at work number.

## 2013-09-22 NOTE — Telephone Encounter (Signed)
Okay with Dr Tawanna Cooler.  Labs ordered and and appointment made.

## 2013-09-23 LAB — TSH: TSH: 0.51 u[IU]/mL (ref 0.35–5.50)

## 2013-10-05 ENCOUNTER — Encounter: Payer: Self-pay | Admitting: Radiology

## 2013-10-05 ENCOUNTER — Encounter (INDEPENDENT_AMBULATORY_CARE_PROVIDER_SITE_OTHER): Payer: 59

## 2013-10-05 DIAGNOSIS — R000 Tachycardia, unspecified: Secondary | ICD-10-CM

## 2013-10-05 DIAGNOSIS — I499 Cardiac arrhythmia, unspecified: Secondary | ICD-10-CM

## 2013-10-05 NOTE — Progress Notes (Signed)
Patient ID: Holly Sims, female   DOB: 1951-09-25, 62 y.o.   MRN: [id number] E Cardio 24hr Holter Monitor applied

## 2013-10-28 ENCOUNTER — Ambulatory Visit (INDEPENDENT_AMBULATORY_CARE_PROVIDER_SITE_OTHER): Payer: 59 | Admitting: Cardiology

## 2013-10-28 ENCOUNTER — Encounter: Payer: Self-pay | Admitting: Cardiology

## 2013-10-28 ENCOUNTER — Encounter: Payer: Self-pay | Admitting: *Deleted

## 2013-10-28 VITALS — BP 124/90 | HR 88 | Ht 63.0 in | Wt 144.0 lb

## 2013-10-28 DIAGNOSIS — I4891 Unspecified atrial fibrillation: Secondary | ICD-10-CM

## 2013-10-28 DIAGNOSIS — I471 Supraventricular tachycardia: Secondary | ICD-10-CM

## 2013-10-28 DIAGNOSIS — R002 Palpitations: Secondary | ICD-10-CM

## 2013-10-28 NOTE — Patient Instructions (Addendum)
Your physician has recommended that you wear an event monitor. Event monitors are medical devices that record the heart's electrical activity. Doctors most often [place] these monitors to diagnose arrhythmias. Arrhythmias are problems with the speed or rhythm of the heartbeat. The monitor is a small, portable device. You can wear one while you do your normal daily activities. This is usually used to diagnose what is causing palpitations/syncope (passing out). 30 day  Your physician has requested that you have an echocardiogram. Echocardiography is a painless test that uses sound waves to create images of your heart. It provides your doctor with information about the size and shape of your heart and how well your heart's chambers and valves are working. This procedure takes approximately one hour. There are no restrictions for this procedure.  Your physician recommends that you schedule a follow-up appointment in: 5-6 weeks with Dr Sueanne Margarita the monitor has been completed).

## 2013-10-29 DIAGNOSIS — I471 Supraventricular tachycardia: Secondary | ICD-10-CM | POA: Insufficient documentation

## 2013-10-29 NOTE — Progress Notes (Signed)
Patient ID: Holly Sims, female   DOB: 24-Apr-1951, 62 y.o.   MRN: [id number] PCP: Dr. Tawanna Cooler  62 yo presents for evaluation of palpitations.  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  Typically, these episodes have occurred about once a month with no trigger and have lasted < 1 minute.  They have not bothered her much and she has never had them evaluated.  However, on 10/5, she awoke with her heart pounding.  This was a regular tachycardia with HR around 130 (she took her pulse).  It lasted for 17 minutes then stopped abruptly.  Her chest was tight but she did not feel lightheaded.  She was doing fine after this until yesterday, when she again woke up from sleep.  This time with an irregular fast heart rhythm with HR in the 90s-130s.  This lasted about 40 minutes then resolved.  No lightheadedness.  At baseline, she denies exertional dyspnea or exertional chest pain.  She has good exercise tolerance and walks for exercise.  She works as a Engineer, civil (consulting) at [employer].    ECG: NSR, low voltage, nonspecific T wave flattening  Labs (2/14): K 4.1, creatinine 0.9, LDL 126, HDL 64 Labs (10/14): TSH, free T3 and T4 normal  PMH: 1. Hypothyroidism 2. Palpitations 3. H/o Scarlet fever as a child  SH: Widow, works as a Engineer, civil (consulting) at [employer], has children, occasional ETOH, no smoking.   FH: Brother and sister died in infancy with congenital heart disease.  Sister with atrial fibrillation in her 30s.  Father with HTN, MI, CHF.   ROS: All systems reviewed and negative except as per HPI.  Current Outpatient Prescriptions  Medication Sig Dispense Refill  . aspirin 81 MG tablet Take 81 mg by mouth daily.      . calcium-vitamin D (OSCAL WITH D) 250-125 MG-UNIT per tablet Take 1 tablet by mouth daily.      Marland Kitchen levothyroxine (SYNTHROID, LEVOTHROID) 50 MCG tablet Take 1 tablet (50 mcg total) by mouth daily.  90 tablet  3  . Multiple Vitamin (MULTIVITAMIN) tablet Take 1 tablet by  mouth daily.       No current facility-administered medications for this visit.    BP 124/90  Pulse 88  Ht 5\' 3"  (1.6 m)  Wt 144 lb (65.318 kg)  BMI 25.51 kg/m2 General: NAD Neck: No JVD, no thyromegaly or thyroid nodule.  Lungs: Clear to auscultation bilaterally with normal respiratory effort. CV: Nondisplaced PMI.  Heart regular S1/S2, no S3/S4, no murmur.  No peripheral edema.  No carotid bruit.  Normal pedal pulses.  Abdomen: Soft, nontender, no hepatosplenomegaly, no distention.  Skin: Intact without lesions or rashes.  Neurologic: Alert and oriented x 3.  Psych: Normal affect. Extremities: No clubbing or cyanosis.  HEENT: Normal.   Assessment/Plan: 62 yo with several patterns of palpitations.  She has had brief episodes of rapid heart beat lasting < 1 minute that have been present about 40 years.  These do not bother her.  She had a episode about a month ago where she had a regular tachycardia for 17 minutes.  She had chest tightness.  Finally, she had an episode yesterday of an irregular tachycardia for about 40 minutes.  Her sister, of note, developed atrial fibrillation in her 30s.  The regular tachycardia raises concern for a form of regular SVT such as AVNRT or atrial tachycardia.  However, the irregular tachycardia yesterday raises concern for possible atrial fibrillation developing.  TSH  was normal recently.  - Echocardiogram, primarily to assess atrial size and to look for any structural abnormalities that may predispose to atrial fibrillation.  - 30 day event monitor to assess for arrhythmia.  - Followup 5-6 wks.  Holly Sims 10/29/2013

## 2013-11-16 ENCOUNTER — Ambulatory Visit (HOSPITAL_COMMUNITY): Payer: 59 | Attending: Cardiology | Admitting: Radiology

## 2013-11-16 ENCOUNTER — Telehealth: Payer: Self-pay | Admitting: Cardiology

## 2013-11-16 ENCOUNTER — Encounter: Payer: Self-pay | Admitting: *Deleted

## 2013-11-16 ENCOUNTER — Encounter: Payer: Self-pay | Admitting: Cardiology

## 2013-11-16 DIAGNOSIS — R002 Palpitations: Secondary | ICD-10-CM | POA: Insufficient documentation

## 2013-11-16 DIAGNOSIS — I359 Nonrheumatic aortic valve disorder, unspecified: Secondary | ICD-10-CM | POA: Insufficient documentation

## 2013-11-16 DIAGNOSIS — I471 Supraventricular tachycardia, unspecified: Secondary | ICD-10-CM | POA: Insufficient documentation

## 2013-11-16 DIAGNOSIS — I059 Rheumatic mitral valve disease, unspecified: Secondary | ICD-10-CM | POA: Insufficient documentation

## 2013-11-16 DIAGNOSIS — I4891 Unspecified atrial fibrillation: Secondary | ICD-10-CM | POA: Insufficient documentation

## 2013-11-16 NOTE — Progress Notes (Signed)
Patient ID: Holly Sims, female   DOB: [birth year], 62 y.o.   MRN: [id number] [employer] 30 day cardiac event monitor applied to patient.

## 2013-11-16 NOTE — Telephone Encounter (Signed)
Message sent to Andee Lineman, monitor technician to schedule patient for eCardio or a different monitor that she thinks will work for patient.

## 2013-11-16 NOTE — Telephone Encounter (Signed)
Spoke with Andee Lineman, monitor technician who explained that [employer] monitors do not work in our building but that [employer] says their monitors connect once the patients are out of our building.  Patient reports that she got to work (@ [employer]) and monitor did not connect.  Patient called [employer] and was told that there is no guarantee that their monitor will work at both patient's home and work, which are several miles apart.  Patient is traveling to [facility] this weekend and again [employer] could not guarantee that their monitor would work there.  Patient states she is mailing this monitor back to [employer] tomorrow, that she does not want to wear something that is not reliable.  I advised patient that I will send message to Dr. Shirlee Latch for recommendation; possibly a different monitor will work better.  I advised patient that I will discuss billing issue with Freda Munro in the office.  Patient verbalized agreement and understanding. Andee Lineman advised that eCardio monitor might work better for patient.

## 2013-11-16 NOTE — Telephone Encounter (Signed)
New message    Received monitor today but it is not working.  The monitor people called her and told her it was not working and for her to disconnect it and mail it back.  She want to make sure we know this so that her ins co would not be billed and she want Dr Shirlee Latch to know she will not be wearing the monitor.

## 2013-11-16 NOTE — Progress Notes (Signed)
Echocardiogram performed.  

## 2013-11-16 NOTE — Telephone Encounter (Signed)
She should not be billed.  Can we find a monitor that will work better.

## 2013-11-16 NOTE — Telephone Encounter (Signed)
There is not a way to make the monitor work? What exactly was the problem?  Please followup on this with her and the monitoring company.

## 2013-11-18 ENCOUNTER — Encounter: Payer: Self-pay | Admitting: *Deleted

## 2013-11-18 ENCOUNTER — Encounter (INDEPENDENT_AMBULATORY_CARE_PROVIDER_SITE_OTHER): Payer: 59

## 2013-11-18 ENCOUNTER — Other Ambulatory Visit: Payer: Self-pay | Admitting: *Deleted

## 2013-11-18 DIAGNOSIS — I4891 Unspecified atrial fibrillation: Secondary | ICD-10-CM

## 2013-11-18 DIAGNOSIS — R002 Palpitations: Secondary | ICD-10-CM

## 2013-11-18 NOTE — Progress Notes (Signed)
Order for event monitor

## 2013-11-18 NOTE — Progress Notes (Signed)
Patient ID: Holly Sims, female   DOB: 1951/02/13, 62 y.o.   MRN: [id number] Patient had a [employer] 30 day cardiac event monitor applied 11/16/2013. She was unable to send a baseline recording at our practice or at her work.  [employer] offered to send her another type of recorder, but the patient refused.  There will be no charge from [place] or our practice for this monitor. The patient agreed to try another brand of cardiac event monitors, so today,  with Dr. Alford Highland consent, an E-Cardio verite 30 day cardiac event monitor was applied to the patient.  Ms. Gose expressed satisfaction with the verite monitor and it appeared to be functioning normally.

## 2013-11-19 ENCOUNTER — Telehealth: Payer: Self-pay | Admitting: Cardiology

## 2013-11-19 NOTE — Telephone Encounter (Signed)
Spoke with patient.

## 2013-11-19 NOTE — Telephone Encounter (Signed)
°  Patient is returning your call. Please call and advise.  °

## 2013-11-30 ENCOUNTER — Telehealth: Payer: Self-pay | Admitting: Cardiology

## 2013-11-30 NOTE — Telephone Encounter (Signed)
New problem       Pt's took the monitor off, pt had a reaction to the glue on the lead pads. Red blisters. Pt had monitor on for 2 wks.   If you have any question please give her.  Pt will be mailing the monitor back.

## 2013-12-28 ENCOUNTER — Encounter: Payer: Self-pay | Admitting: Cardiology

## 2013-12-28 ENCOUNTER — Ambulatory Visit (INDEPENDENT_AMBULATORY_CARE_PROVIDER_SITE_OTHER): Payer: 59 | Admitting: Cardiology

## 2013-12-28 VITALS — BP 114/64 | HR 73 | Ht 63.0 in | Wt 143.0 lb

## 2013-12-28 DIAGNOSIS — I498 Other specified cardiac arrhythmias: Secondary | ICD-10-CM

## 2013-12-28 DIAGNOSIS — I359 Nonrheumatic aortic valve disorder, unspecified: Secondary | ICD-10-CM

## 2013-12-28 DIAGNOSIS — I471 Supraventricular tachycardia: Secondary | ICD-10-CM | POA: Insufficient documentation

## 2013-12-28 DIAGNOSIS — I351 Nonrheumatic aortic (valve) insufficiency: Secondary | ICD-10-CM | POA: Insufficient documentation

## 2013-12-28 NOTE — Progress Notes (Signed)
Patient ID: Holly Sims, female   DOB: 10-04-1951, 63 y.o.   MRN: [id number] PCP: Dr. Sherren Mocha  63 yo presented initially for evaluation of palpitations.  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  Typically, these episodes have occurred about once a month with no trigger and have lasted < 1 minute.  They have not bothered her much and she has never had them evaluated.  However, on 09/19/13, she awoke with her heart pounding.  This was a regular tachycardia with HR around 130 (she took her pulse).  It lasted for 17 minutes then stopped abruptly.  Her chest was tight but she did not feel lightheaded.  She was doing fine after this until yesterday, when she again woke up from sleep.  This time with an irregular fast heart rhythm with HR in the 90s-130s.  This lasted about 40 minutes then resolved.  No lightheadedness.  At baseline, she denies exertional dyspnea or exertional chest pain.  She has good exercise tolerance and walks for exercise.  She works as a Marine scientist at [employer]    I had her do an echocardiogram, which showed mild AI and MR with normal EF.  I also had her wear an event monitor for 30 days to look for atrial fibrillation.  This showed a short run of atrial tachycardia, no atrial fibrillation.  Labs (2/14): K 4.1, creatinine 0.9, LDL 126, HDL 64 Labs (10/14): TSH, free T3 and T4 normal  ECG: NSR, normal  PMH: 1. Hypothyroidism 2. Palpitations: Echo (12/14) with EF 55-60%, mild MR, mild AI.  Event monitor (1/15) with short run of atrial tachycardia 3. 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   FH: Brother and sister died in infancy with congenital heart disease.  Sister with atrial fibrillation in her 62s.  Father with HTN, MI, CHF.   ROS: All systems reviewed and negative except as per HPI.  Current Outpatient Prescriptions  Medication Sig Dispense Refill  . aspirin 81  MG tablet Take 81 mg by mouth daily.      . calcium-vitamin D (OSCAL WITH D) 250-125 MG-UNIT per tablet Take 1 tablet by mouth daily.      Marland Kitchen levothyroxine (SYNTHROID, LEVOTHROID) 50 MCG tablet Take 1 tablet (50 mcg total) by mouth daily.  90 tablet  3  . Multiple Vitamin (MULTIVITAMIN) tablet Take 1 tablet by mouth daily.       No current facility-administered medications for this visit.    BP 114/64  Pulse 73  Ht 5\' 3"  (1.6 m)  Wt 64.864 kg (143 lb)  BMI 25.34 kg/m2 General: NAD Neck: No JVD, no thyromegaly or thyroid nodule.  Lungs: Clear to auscultation bilaterally with normal respiratory effort. CV: Nondisplaced PMI.  Heart regular S1/S2, no S3/S4, no murmur.  No peripheral edema.  No carotid bruit.  Normal pedal pulses.  Abdomen: Soft, nontender, no hepatosplenomegaly, no distention.  Skin: Intact without lesions or rashes.  Neurologic: Alert and oriented x 3.  Psych: Normal affect. Extremities: No clubbing or cyanosis.  HEENT: Normal.   Assessment/Plan: 1. Palpitations: Monitor showed a short run of atrial tachycardia, no atrial fibrillation or flutter. This may have been the cause of her symptoms.  As symptoms are rare at this point, no additional medications will be started (she wants to avoid medicine).   2. 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   I  would be reasonable to repeat echo in 3 years or so to follow for worsening of valvular disease.    Loralie Champagne 12/28/2013

## 2014-04-12 ENCOUNTER — Telehealth: Payer: Self-pay | Admitting: Family Medicine

## 2014-04-12 DIAGNOSIS — E039 Hypothyroidism, unspecified: Secondary | ICD-10-CM

## 2014-04-12 MED ORDER — LEVOTHYROXINE SODIUM 50 MCG PO TABS: 50.0000 ug | ORAL_TABLET | Freq: Every day | ORAL | Status: AC

## 2014-04-12 NOTE — Telephone Encounter (Signed)
[facility] [place], [place] - [address] is requesting re-fill on levothyroxine (SYNTHROID, LEVOTHROID) 50 MCG tablet

## 2014-06-26 IMAGING — MG MM DIGITAL SCREENING BILAT
4 series · 4 of 4 positions shown · non-contrast
Comparison: Previous exam(s).

CLINICAL DATA: Screening.

DIGITAL SCREENING BILATERAL MAMMOGRAM WITH CAD

[R CC]
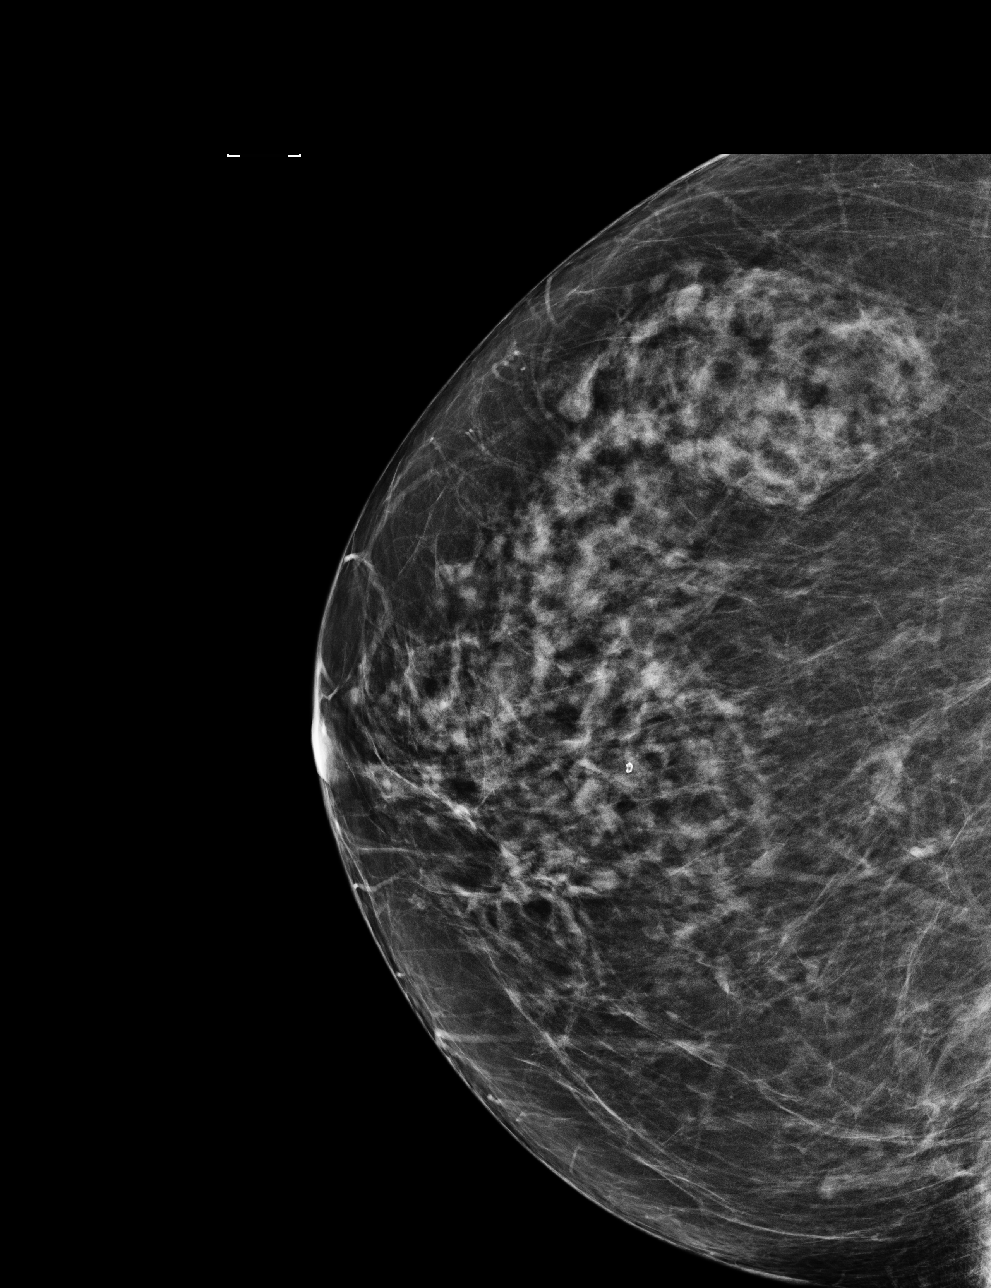

[L MLO]
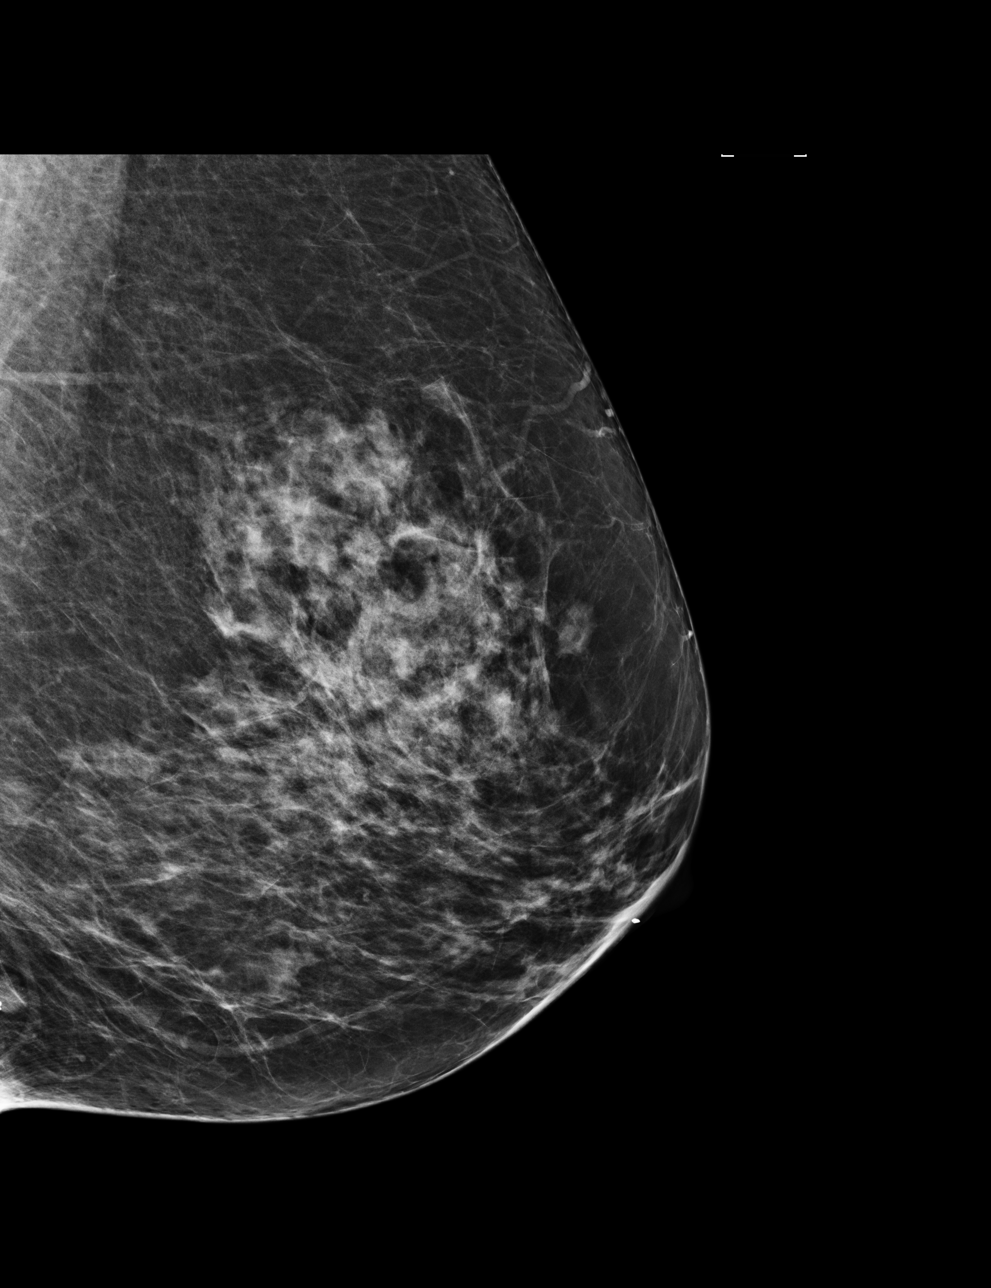

[R MLO]
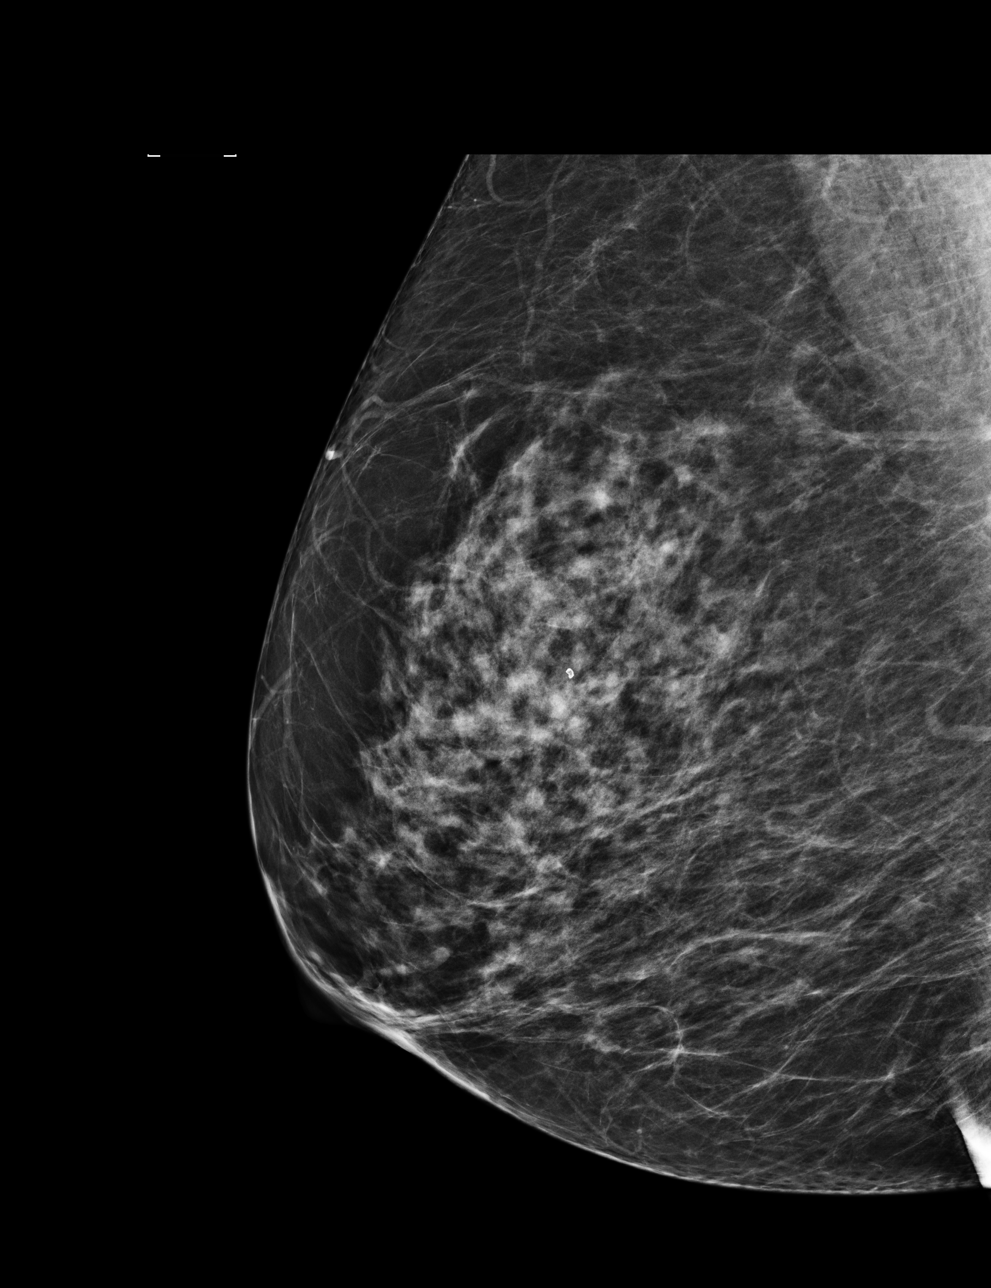

[L CC]
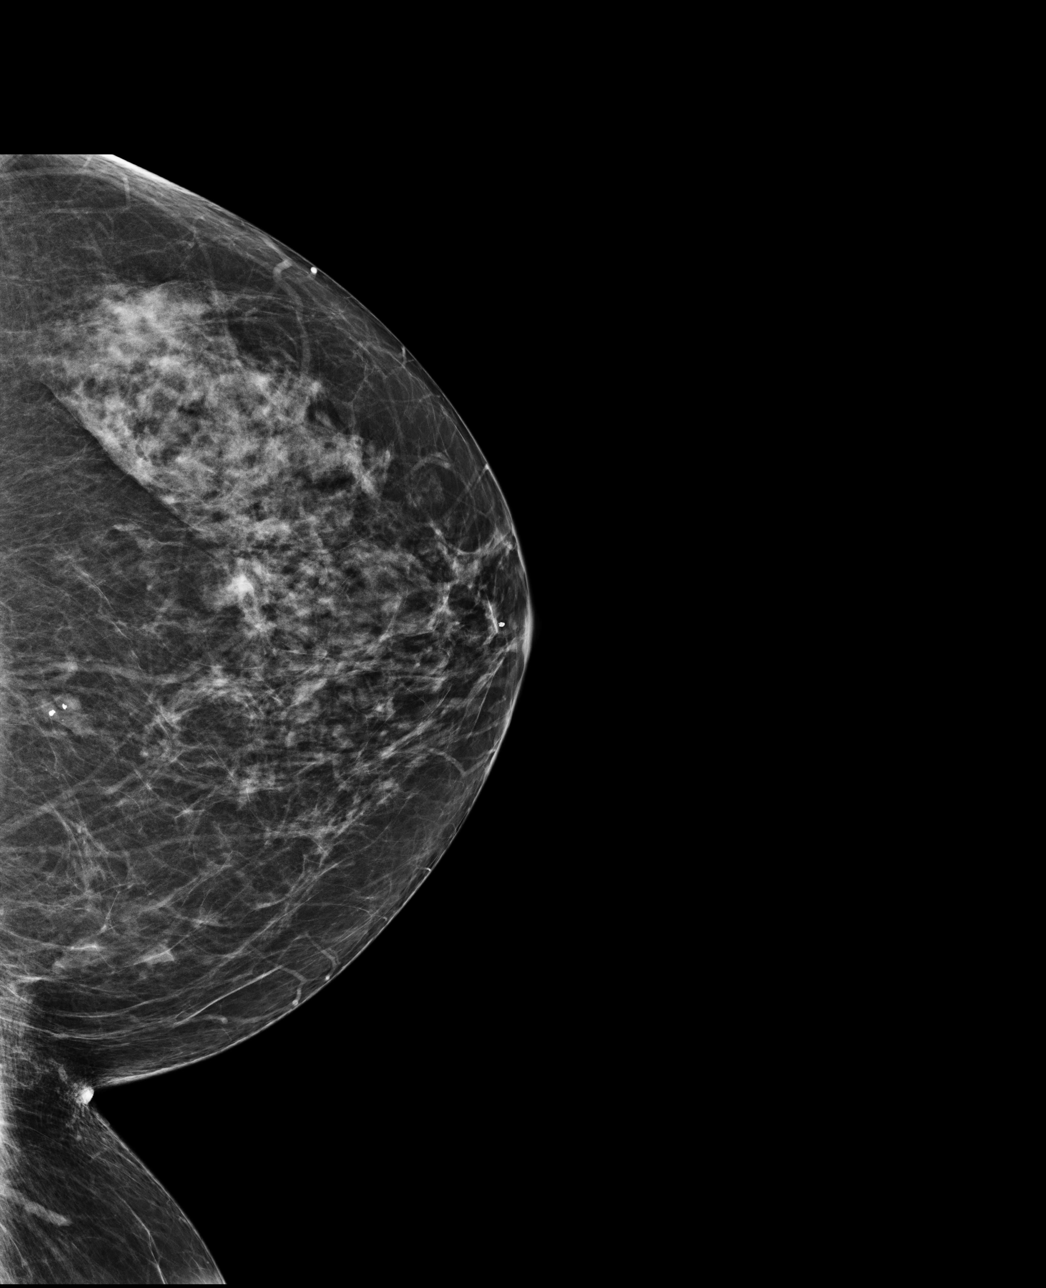

[4 of 4 positions shown; findings below may reference images not displayed]

FINDINGS: ACR Breast Density Category b:  There are scattered areas of
fibroglandular density.

There are no findings suspicious for malignancy.

Images were processed with CAD.
IMPRESSION: No mammographic evidence of malignancy.

A result letter of this screening mammogram will be mailed directly
to the patient.

RECOMMENDATION:
Screening mammogram in one year. (Code:RE-T-HFS)

BI-RADS CATEGORY 1:  Negative.

## 2014-08-17 ENCOUNTER — Encounter: Payer: Self-pay | Admitting: Family Medicine

## 2014-08-17 ENCOUNTER — Ambulatory Visit (INDEPENDENT_AMBULATORY_CARE_PROVIDER_SITE_OTHER): Payer: Medicare Other | Admitting: Family Medicine

## 2014-08-17 VITALS — BP 120/80 | Temp 98.1°F | Ht 62.75 in | Wt 136.0 lb

## 2014-08-17 DIAGNOSIS — I340 Nonrheumatic mitral (valve) insufficiency: Secondary | ICD-10-CM

## 2014-08-17 DIAGNOSIS — I351 Nonrheumatic aortic (valve) insufficiency: Secondary | ICD-10-CM

## 2014-08-17 DIAGNOSIS — Z87448 Personal history of other diseases of urinary system: Secondary | ICD-10-CM

## 2014-08-17 DIAGNOSIS — I471 Supraventricular tachycardia, unspecified: Secondary | ICD-10-CM

## 2014-08-17 DIAGNOSIS — I059 Rheumatic mitral valve disease, unspecified: Secondary | ICD-10-CM

## 2014-08-17 DIAGNOSIS — Z23 Encounter for immunization: Secondary | ICD-10-CM

## 2014-08-17 DIAGNOSIS — E039 Hypothyroidism, unspecified: Secondary | ICD-10-CM

## 2014-08-17 DIAGNOSIS — I359 Nonrheumatic aortic valve disorder, unspecified: Secondary | ICD-10-CM

## 2014-08-17 LAB — POCT URINALYSIS DIPSTICK
BILIRUBIN UA: NEGATIVE
Glucose, UA: NEGATIVE
Ketones, UA: NEGATIVE
NITRITE UA: NEGATIVE
PH UA: 5.5
PROTEIN UA: NEGATIVE
Spec Grav, UA: 1.015
Urobilinogen, UA: 0.2

## 2014-08-17 LAB — CBC WITH DIFFERENTIAL/PLATELET
BASOS ABS: 0 10*3/uL (ref 0.0–0.1)
Basophils Relative: 0.5 % (ref 0.0–3.0)
Eosinophils Absolute: 0.1 10*3/uL (ref 0.0–0.7)
Eosinophils Relative: 2.9 % (ref 0.0–5.0)
HEMATOCRIT: 40.1 % (ref 36.0–46.0)
Hemoglobin: 13.5 g/dL (ref 12.0–15.0)
LYMPHS ABS: 1.1 10*3/uL (ref 0.7–4.0)
LYMPHS PCT: 31.7 % (ref 12.0–46.0)
MCHC: 33.8 g/dL (ref 30.0–36.0)
MCV: 91.2 fl (ref 78.0–100.0)
MONOS PCT: 8 % (ref 3.0–12.0)
Monocytes Absolute: 0.3 10*3/uL (ref 0.1–1.0)
NEUTROS PCT: 56.9 % (ref 43.0–77.0)
Neutro Abs: 2 10*3/uL (ref 1.4–7.7)
PLATELETS: 300 10*3/uL (ref 150.0–400.0)
RBC: 4.4 Mil/uL (ref 3.87–5.11)
RDW: 12.9 % (ref 11.5–15.5)
WBC: 3.5 10*3/uL — AB (ref 4.0–10.5)

## 2014-08-17 LAB — HEPATIC FUNCTION PANEL
ALBUMIN: 4.2 g/dL (ref 3.5–5.2)
ALK PHOS: 72 U/L (ref 39–117)
ALT: 20 U/L (ref 0–35)
AST: 20 U/L (ref 0–37)
Bilirubin, Direct: 0 mg/dL (ref 0.0–0.3)
TOTAL PROTEIN: 7.1 g/dL (ref 6.0–8.3)
Total Bilirubin: 0.7 mg/dL (ref 0.2–1.2)

## 2014-08-17 LAB — BASIC METABOLIC PANEL
BUN: 17 mg/dL (ref 6–23)
CO2: 27 mEq/L (ref 19–32)
CREATININE: 1 mg/dL (ref 0.4–1.2)
Calcium: 9.6 mg/dL (ref 8.4–10.5)
Chloride: 105 mEq/L (ref 96–112)
GFR: 61.13 mL/min (ref >60.00)
Glucose, Bld: 88 mg/dL (ref 70–99)
POTASSIUM: 4.5 meq/L (ref 3.5–5.1)
Sodium: 141 mEq/L (ref 135–145)

## 2014-08-17 LAB — LIPID PANEL
CHOLESTEROL: 246 mg/dL — AB (ref 0–200)
HDL: 66.8 mg/dL (ref >39.00)
LDL Cholesterol: 154 mg/dL — ABNORMAL HIGH (ref 0–99)
NonHDL: 179.2
TRIGLYCERIDES: 125 mg/dL (ref 0.0–149.0)
Total CHOL/HDL Ratio: 4
VLDL: 25 mg/dL (ref 0.0–40.0)

## 2014-08-17 LAB — TSH: TSH: 0.15 u[IU]/mL — AB (ref 0.35–4.50)

## 2014-08-17 NOTE — Progress Notes (Signed)
   Subjective:    Patient ID: Holly Sims, female    DOB: [birth year], 63 y.o.   MRN: [id number]  HPI Holly Sims is a 63 year old widowed female,,,,,,,,, her husband Holly Sims died 3 years ago from pancreatic cancer,,,,,,,,, who comes in today for evaluation of hypothyroidism mitral insufficiency and aortic insufficiency  She takes Synthroid 50 mcg daily for hypothyroidism labs today  She had a history of atrial tachycardia asymptomatic now. She's had mild AI and mild MR. Cardiac evaluation by Dr. Aundra Dubin in January recommended echo in 3 years.  She gets routine eye care, dental care, BSE monthly, mammography she skipped last year, colonoscopy and GI, vaccinations updated by Apolonio Schneiders............. she was given Pneumovax 13, tetanus booster with pertussis, and flu shot.  After her husband died she sold her house and now moved to [facility]. Her daughter and grandchildren live there. She retired as a Retail buyer at [employer] function normal she walks daily home health safety reviewed no issues identified, no guns in the house, she does have a health care power of attorney and living will   Review of Systems  Constitutional: Negative.   HENT: Negative.   Eyes: Negative.   Respiratory: Negative.   Cardiovascular: Negative.   Gastrointestinal: Negative.   Genitourinary: Negative.   Musculoskeletal: Negative.   Neurological: Negative.   Psychiatric/Behavioral: Negative.        Objective:   Physical Exam  Nursing note and vitals reviewed. Constitutional: She appears well-developed and well-nourished.  HENT:  Head: Normocephalic and atraumatic.  Right Ear: External ear normal.  Left Ear: External ear normal.  Nose: Nose normal.  Mouth/Throat: Oropharynx is clear and moist.  Eyes: EOM are normal. Pupils are equal, round, and reactive to light.  Neck: Normal range of motion. Neck supple. No thyromegaly present.  Cardiovascular: Normal rate, regular rhythm,  normal heart sounds and intact distal pulses.  Exam reveals no gallop and no friction rub.   No murmur heard. Pulmonary/Chest: Effort normal and breath sounds normal.  Abdominal: Soft. Bowel sounds are normal. She exhibits no distension and no mass. There is no tenderness. There is no rebound.  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 Ovaries were left intact. She understands that a pelvic examination is not a screening test for brain cancer. She has no symptoms of bloating etc. etc. therefore pelvic examination not done  Musculoskeletal: Normal range of motion.  Lymphadenopathy:    She has no cervical adenopathy.  Neurological: She is alert. She has normal reflexes. No cranial nerve deficit. She exhibits normal muscle tone. Coordination normal.  Skin: Skin is warm and dry.  Total body skin exam shows a garden variety of freckles moles A. hemangioma and seborrheic keratosis. He has a birthmark on her back also a hyperpigmented spot around 2" x 2" on the left side of her face.  Psychiatric: She has a normal mood and affect. Her behavior is normal. Judgment and thought content normal.          Assessment & Plan:  Hypothyroidism......Marland Kitchen refill Synthroid check labs  History of atrial tachycardia MI and AI..... Asymptomatic...Marland KitchenMarland KitchenMarland Kitchen echocardiogram in 3 years  6.

## 2014-08-17 NOTE — Patient Instructions (Signed)
Continue your Synthroid and aspirin  Continue your exercise  Get setup for your screening mammogram  Labs today  ................Marland Kitchen we will call you the report  .

## 2014-08-21 LAB — VITAMIN D 1,25 DIHYDROXY
VITAMIN D 1, 25 (OH) TOTAL: 46 pg/mL (ref 18–72)
Vitamin D2 1, 25 (OH)2: <8 pg/mL
Vitamin D3 1, 25 (OH)2: 46 pg/mL

## 2014-09-30 ENCOUNTER — Other Ambulatory Visit: Payer: Self-pay

## 2015-01-31 ENCOUNTER — Encounter: Payer: Self-pay | Admitting: Internal Medicine

## 2015-08-03 ENCOUNTER — Encounter: Payer: Self-pay | Admitting: Internal Medicine

## 2017-09-05 ENCOUNTER — Encounter: Payer: Self-pay | Admitting: Family Medicine
